# Patient Record
Sex: Female | Born: 1980 | Marital: Married | Smoking: Never smoker
Health system: Southern US, Community
[De-identification: ages and names within clinical notes are randomized; demographics above are authoritative.]

## PROBLEM LIST (undated history)

## (undated) ENCOUNTER — Inpatient Hospital Stay: Payer: Self-pay

## (undated) DIAGNOSIS — O1490 Unspecified pre-eclampsia, unspecified trimester: Secondary | ICD-10-CM

## (undated) HISTORY — DX: Unspecified pre-eclampsia, unspecified trimester: O14.90

## (undated) HISTORY — PX: CHOLECYSTECTOMY: SHX55

---

## 2005-02-26 ENCOUNTER — Emergency Department: Payer: Self-pay | Admitting: Emergency Medicine

## 2007-02-24 ENCOUNTER — Observation Stay: Payer: Self-pay

## 2007-02-28 ENCOUNTER — Observation Stay: Payer: Self-pay | Admitting: Obstetrics and Gynecology

## 2007-03-04 ENCOUNTER — Ambulatory Visit: Payer: Self-pay

## 2007-03-15 ENCOUNTER — Inpatient Hospital Stay: Payer: Self-pay

## 2007-03-15 ENCOUNTER — Observation Stay: Payer: Self-pay

## 2007-03-16 DIAGNOSIS — O139 Gestational [pregnancy-induced] hypertension without significant proteinuria, unspecified trimester: Secondary | ICD-10-CM

## 2007-06-26 ENCOUNTER — Ambulatory Visit: Payer: Self-pay | Admitting: Family Medicine

## 2007-07-09 ENCOUNTER — Ambulatory Visit: Payer: Self-pay | Admitting: General Surgery

## 2007-07-11 ENCOUNTER — Ambulatory Visit: Payer: Self-pay | Admitting: General Surgery

## 2008-02-16 ENCOUNTER — Encounter: Payer: Self-pay | Admitting: Obstetrics and Gynecology

## 2008-03-01 ENCOUNTER — Observation Stay: Payer: Self-pay

## 2008-03-02 ENCOUNTER — Ambulatory Visit: Payer: Self-pay | Admitting: Obstetrics & Gynecology

## 2008-03-03 ENCOUNTER — Inpatient Hospital Stay: Payer: Self-pay

## 2008-03-18 ENCOUNTER — Observation Stay: Payer: Self-pay | Admitting: Unknown Physician Specialty

## 2008-03-22 ENCOUNTER — Observation Stay: Payer: Self-pay

## 2008-04-03 ENCOUNTER — Inpatient Hospital Stay: Payer: Self-pay

## 2008-04-03 DIAGNOSIS — O139 Gestational [pregnancy-induced] hypertension without significant proteinuria, unspecified trimester: Secondary | ICD-10-CM

## 2008-10-09 IMAGING — US ABDOMEN ULTRASOUND
1 series · 17 of 25 positions shown · non-contrast
Comparison: none

REASON FOR EXAM: epigastric pain
COMMENTS:

[Series 1: abdomen ultrasound · 17 of 65 slices shown]
[im 1/65]
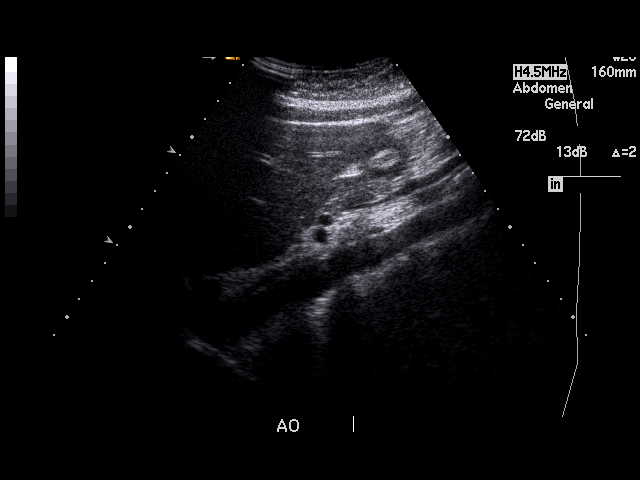
[im 6/65]
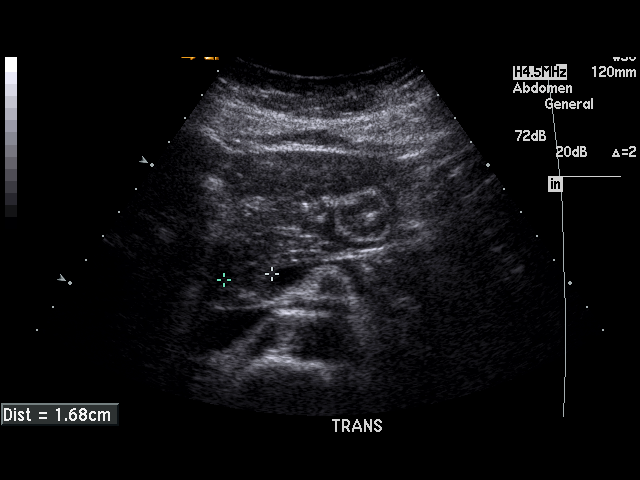
[im 9/65]
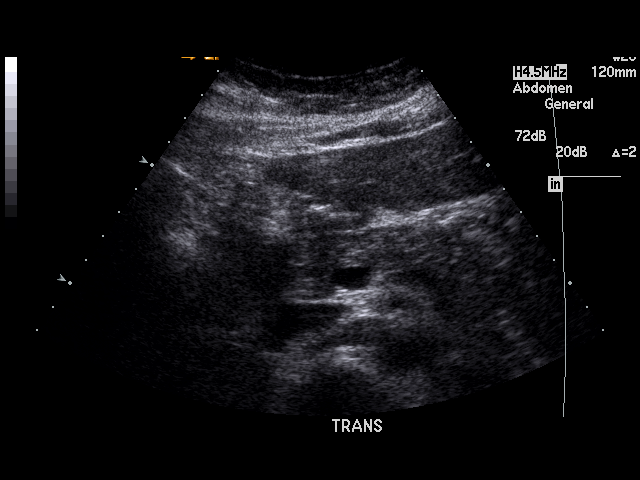
[im 14/65]
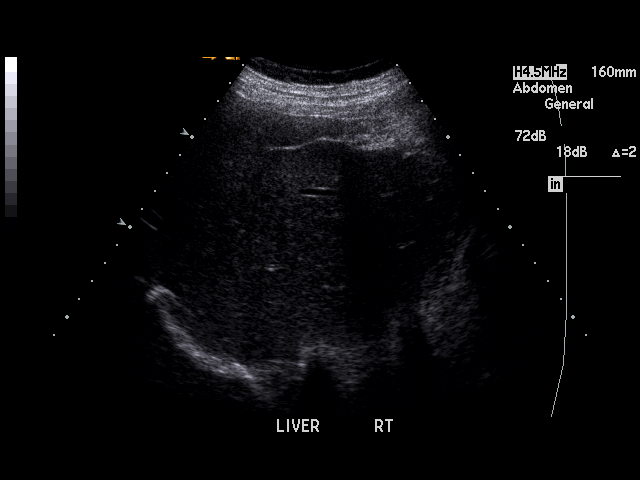
[im 17/65]
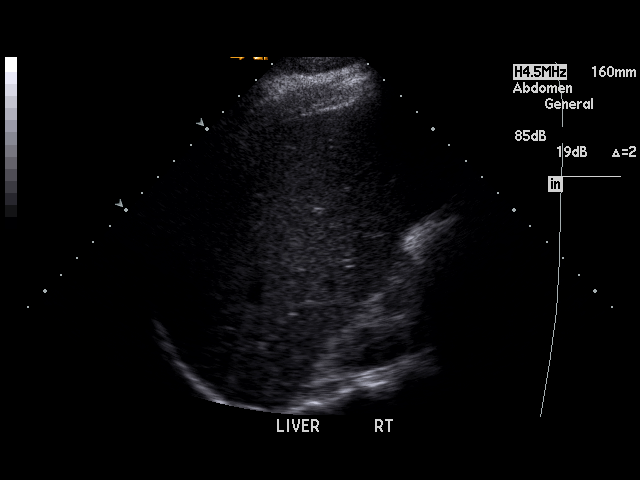
[im 22/65]
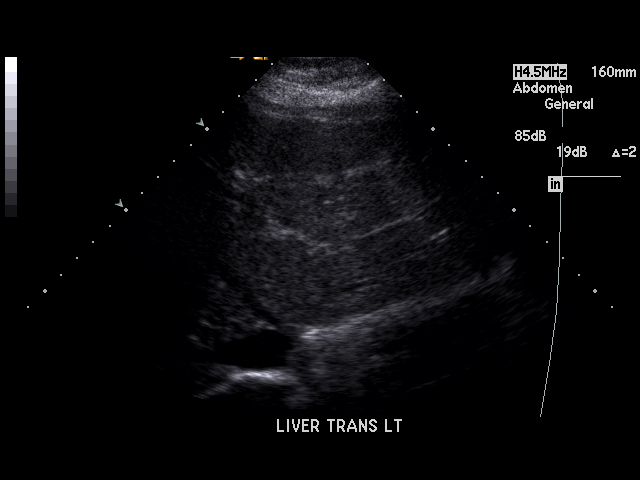
[im 25/65]
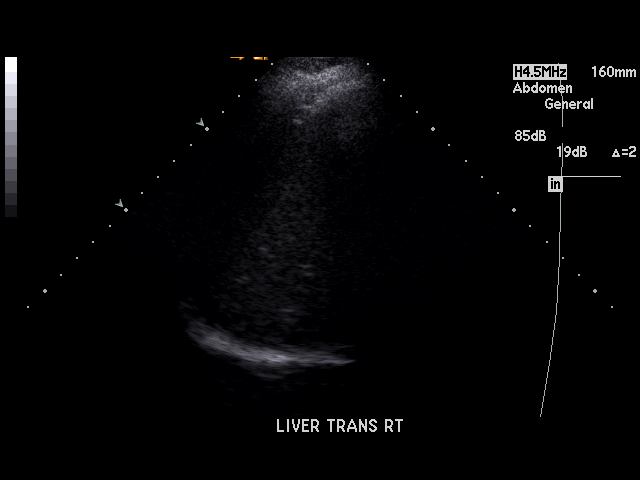
[im 30/65]
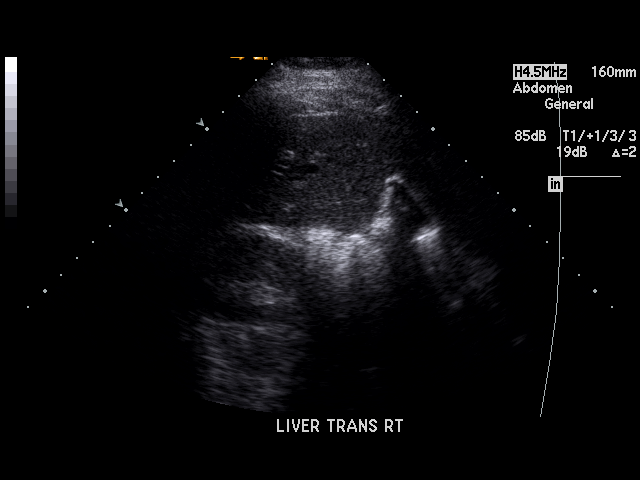
[im 33/65]
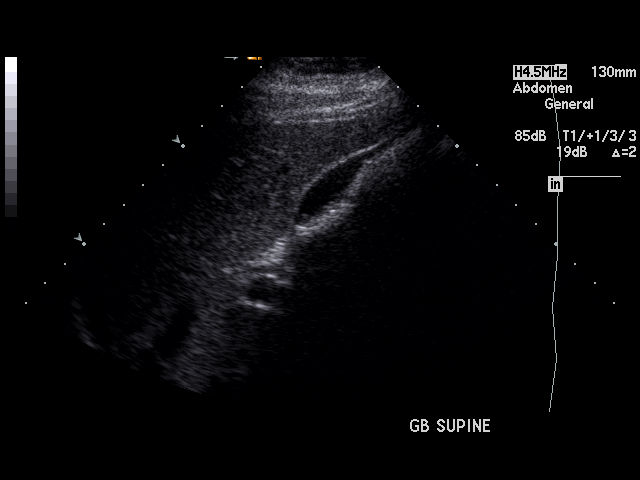
[im 35/65]
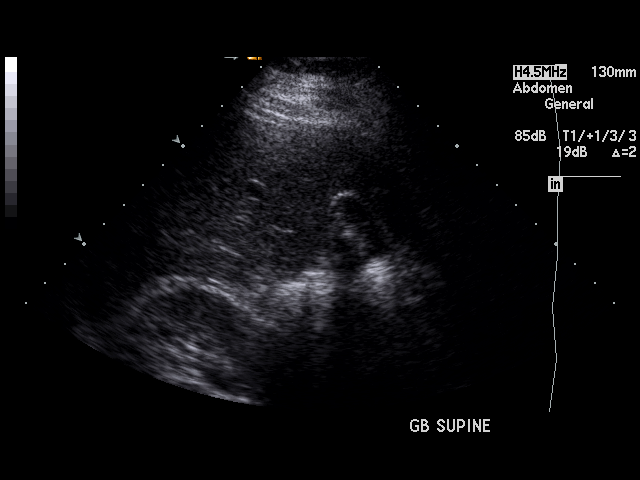
[im 41/65]
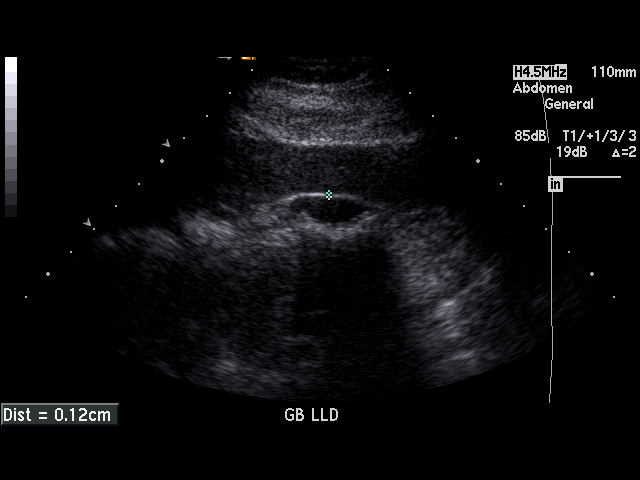
[im 43/65]
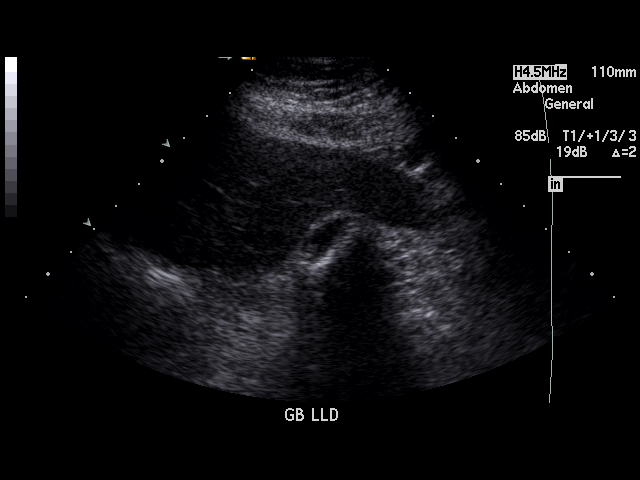
[im 49/65]
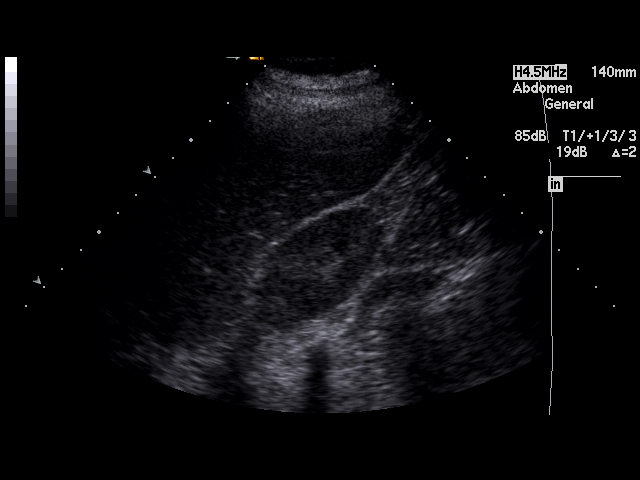
[im 51/65]
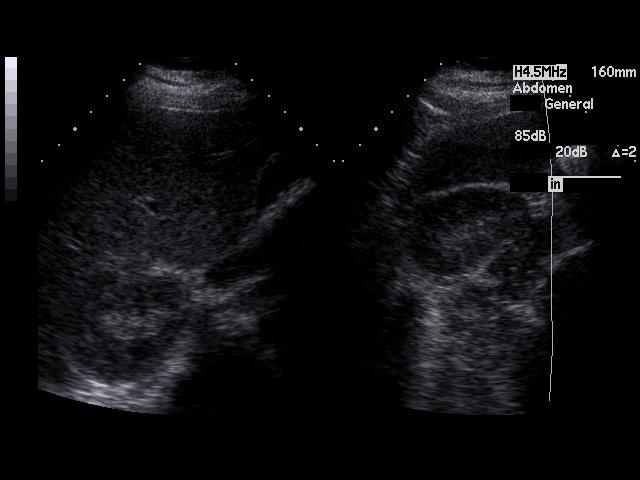
[im 57/65]
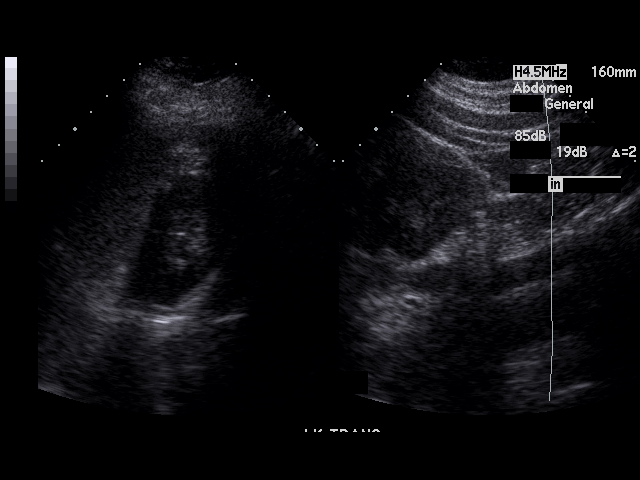
[im 59/65]
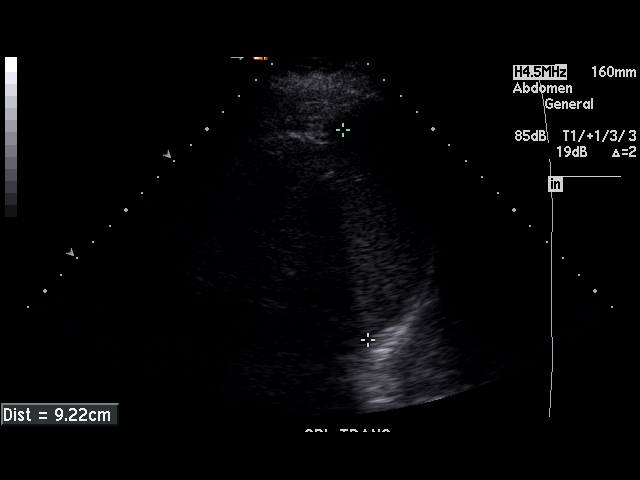
[im 65/65]
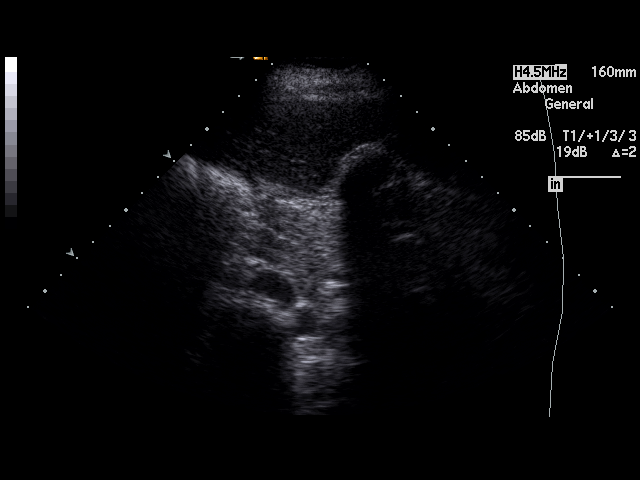

[17 of 25 positions shown; findings below may reference images not displayed]

PROCEDURE:     US  - US ABDOMEN GENERAL SURVEY  - June 26, 2007 [DATE]

RESULT:     The liver, spleen, pancreas, abdominal aorta and inferior vena
cava show no significant abnormalities. There are multiple mobile
echodensities in the gallbladder compatible with gallstones. No thickening
of the gallbladder wall is seen. The common bile duct measures 4.5 mm in
diameter which is within normal limits. The kidneys show no hydronephrosis.
There is no ascites.
IMPRESSION: 1.     Cholelithiasis.

## 2009-07-01 ENCOUNTER — Emergency Department: Payer: Self-pay | Admitting: Emergency Medicine

## 2009-07-18 ENCOUNTER — Encounter: Payer: Self-pay | Admitting: Family Medicine

## 2009-08-14 ENCOUNTER — Encounter: Payer: Self-pay | Admitting: Family Medicine

## 2009-12-02 ENCOUNTER — Ambulatory Visit: Payer: Self-pay | Admitting: Family Medicine

## 2010-10-15 IMAGING — CR CERVICAL SPINE - COMPLETE 4+ VIEW
1 series · 5 of 5 positions shown · non-contrast
Comparison: none

REASON FOR EXAM: neck  pain
COMMENTS:

[Series 1: view not recorded · 0.17mm/px · 5 of 5 slices shown]
[im 1/5]
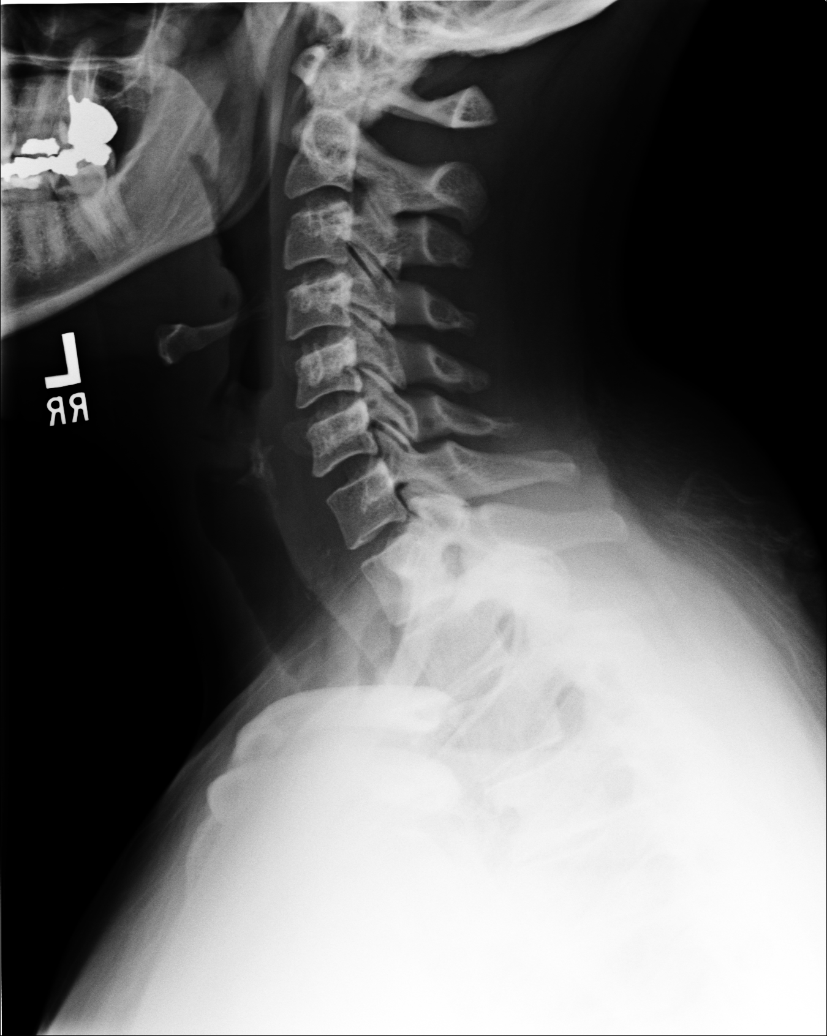
[im 2/5]
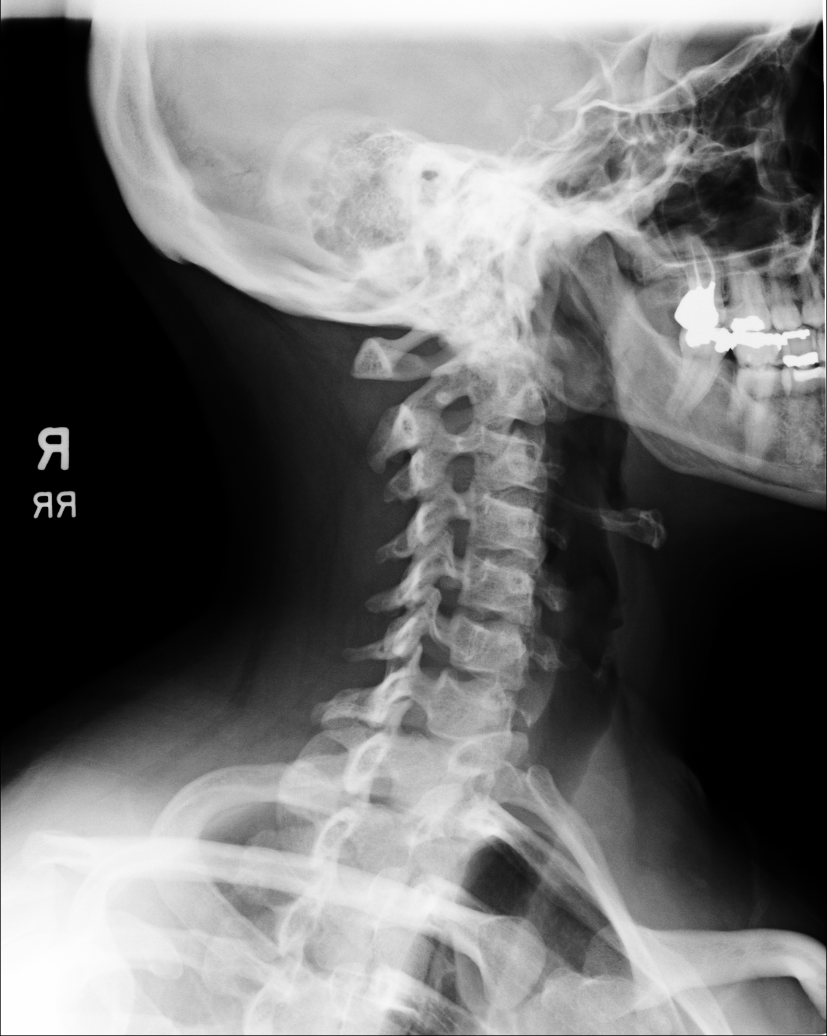
[im 3/5]
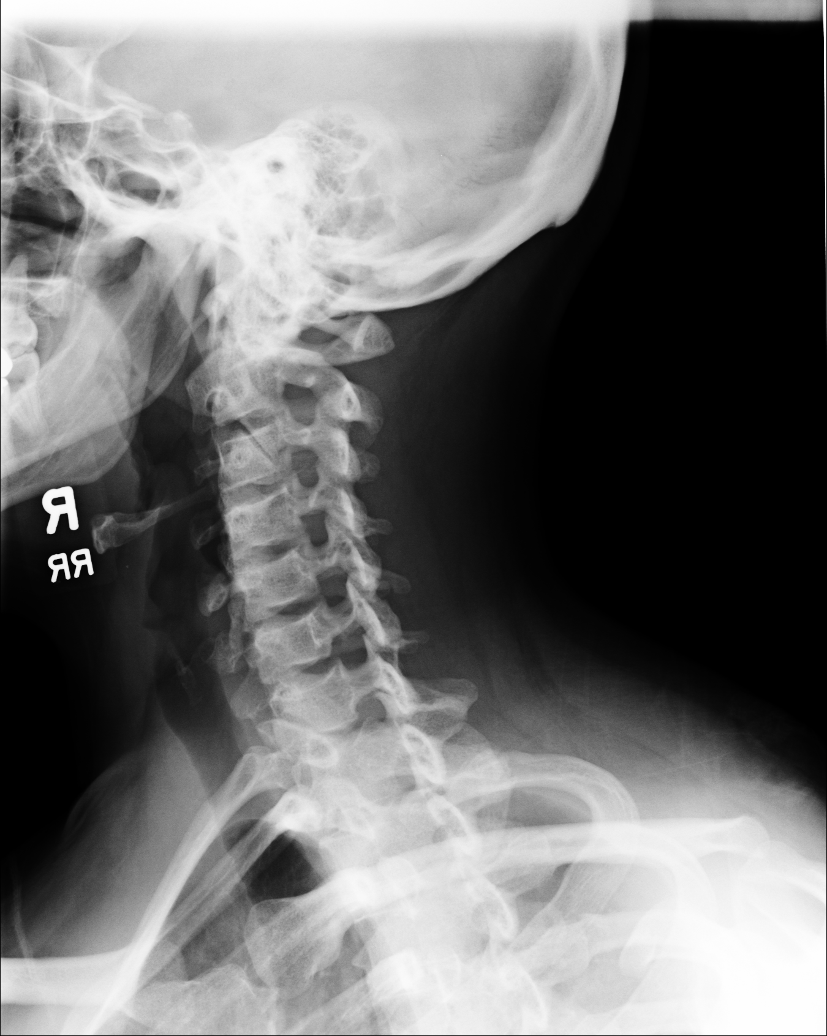
[im 4/5]
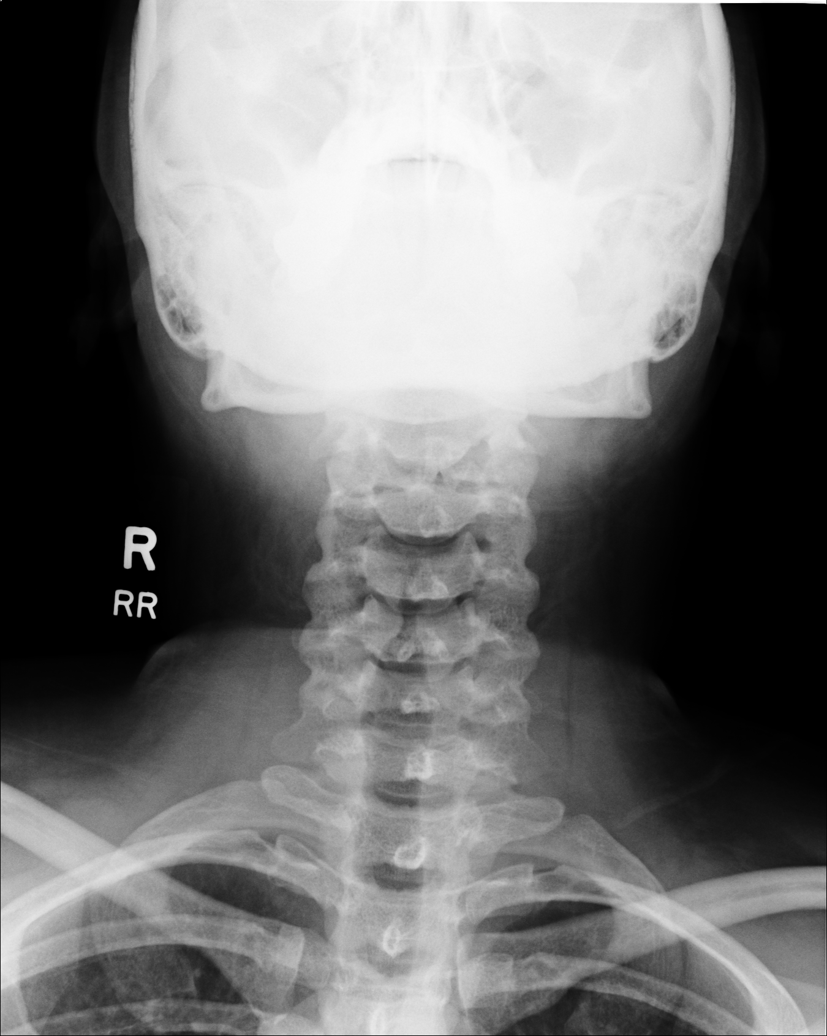
[im 5/5]
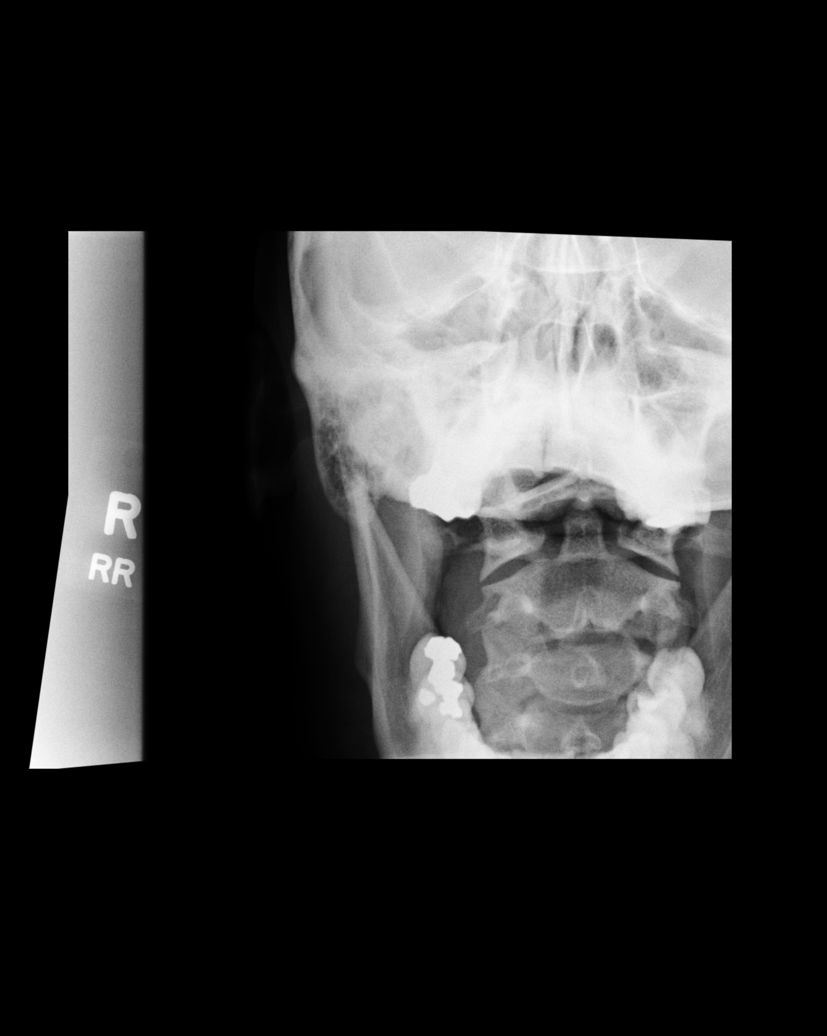

[5 of 5 positions shown; findings below may reference images not displayed]

PROCEDURE:     DXR - DXR CERVICAL SPINE COMPLETE  - July 01, 2009 [DATE]

RESULT:     The vertebral body heights and the intervertebral disc spaces
are well maintained. The vertebral body alignment is normal. Oblique views
show the neural foramina to be widely patent bilaterally. The odontoid
process is intact. Soft tissues of the cervical area are normal in
appearance.
IMPRESSION: No significant abnormalities are noted.

## 2011-02-03 ENCOUNTER — Emergency Department: Payer: Self-pay | Admitting: Unknown Physician Specialty

## 2011-03-18 IMAGING — CR DG WRIST COMPLETE 3+V*R*
1 series · 4 of 4 positions shown · non-contrast
Comparison: none

REASON FOR EXAM: [id number] right wrist sprain
COMMENTS:

PROCEDURE:     KDR - KDXR WRIST RT COMP WITH OBLIQUES  - December 02, 2009  [DATE]
RESULT:     No fracture, dislocation or other acute bony abnormality is
identified. No periosteal reactive changes are noted. No lytic or blastic
lesions are seen.

[Series 2: view not recorded · 0.17mm/px · 4 of 4 slices shown]
[im 1/4]
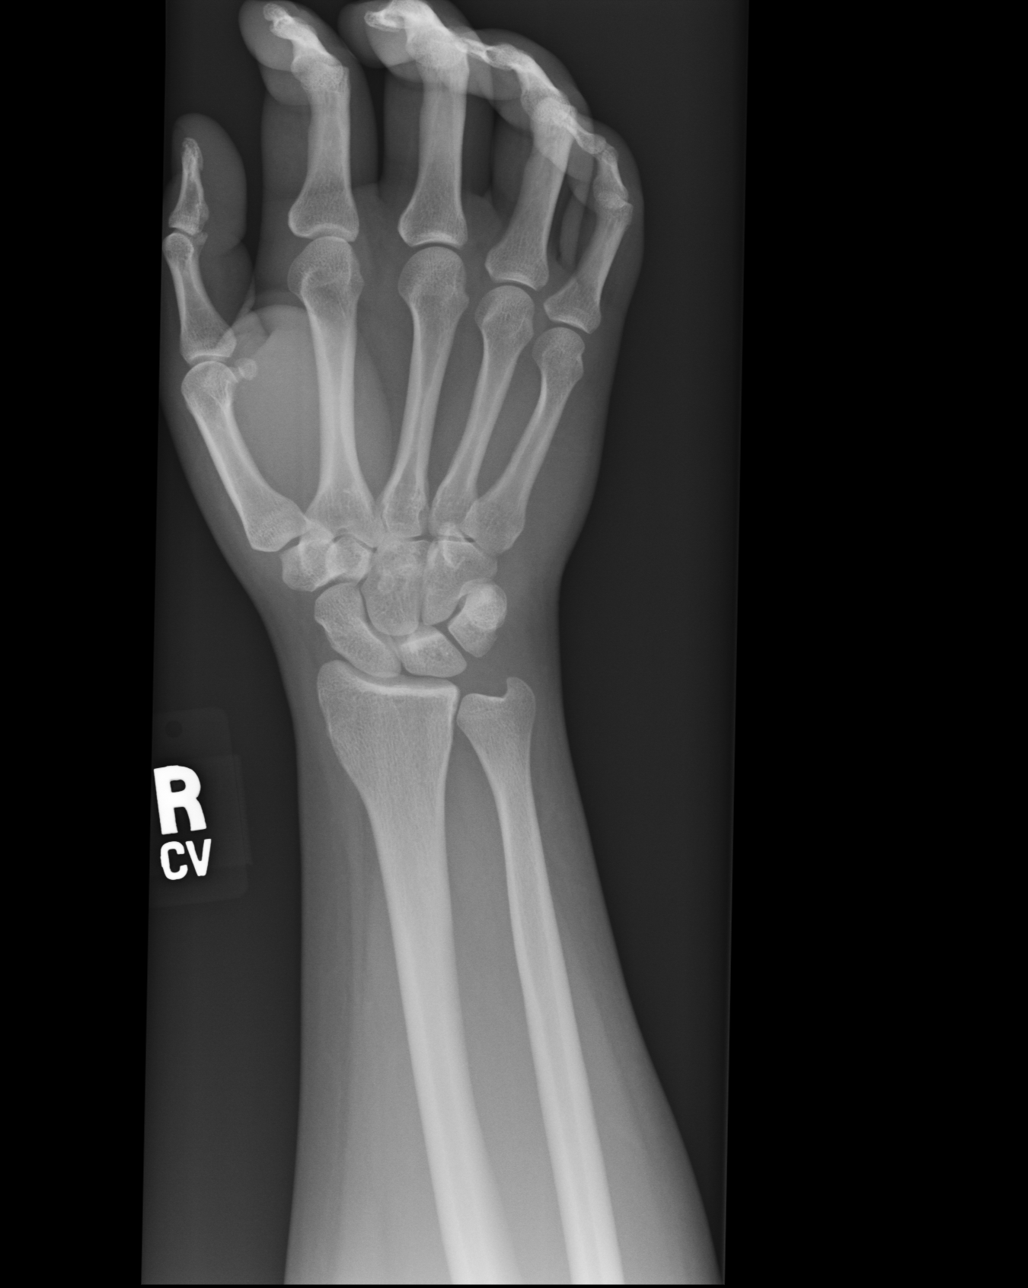
[im 2/4]
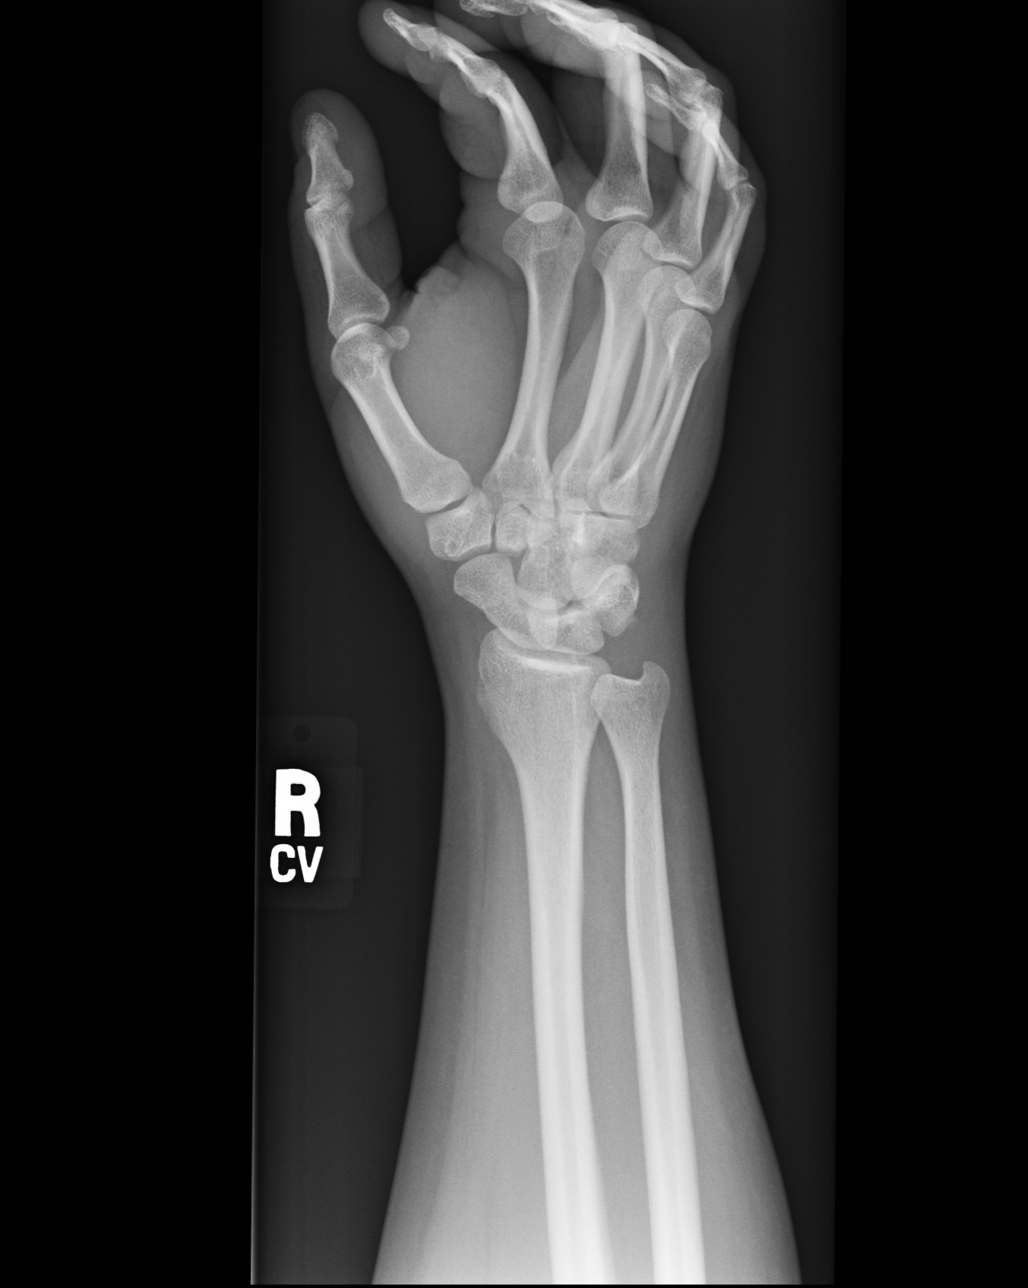
[im 3/4]
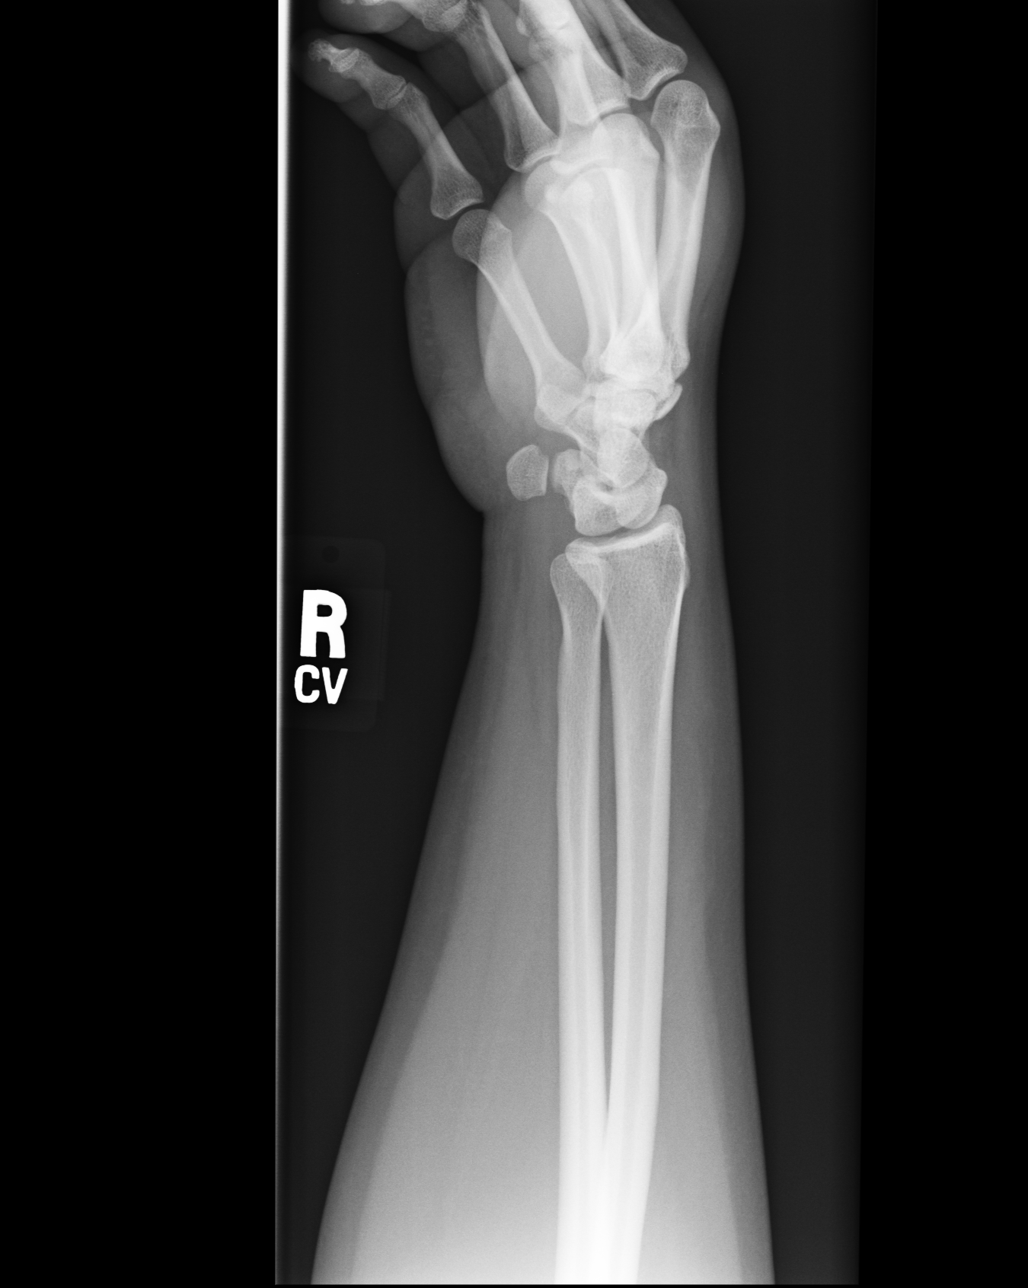
[im 4/4]
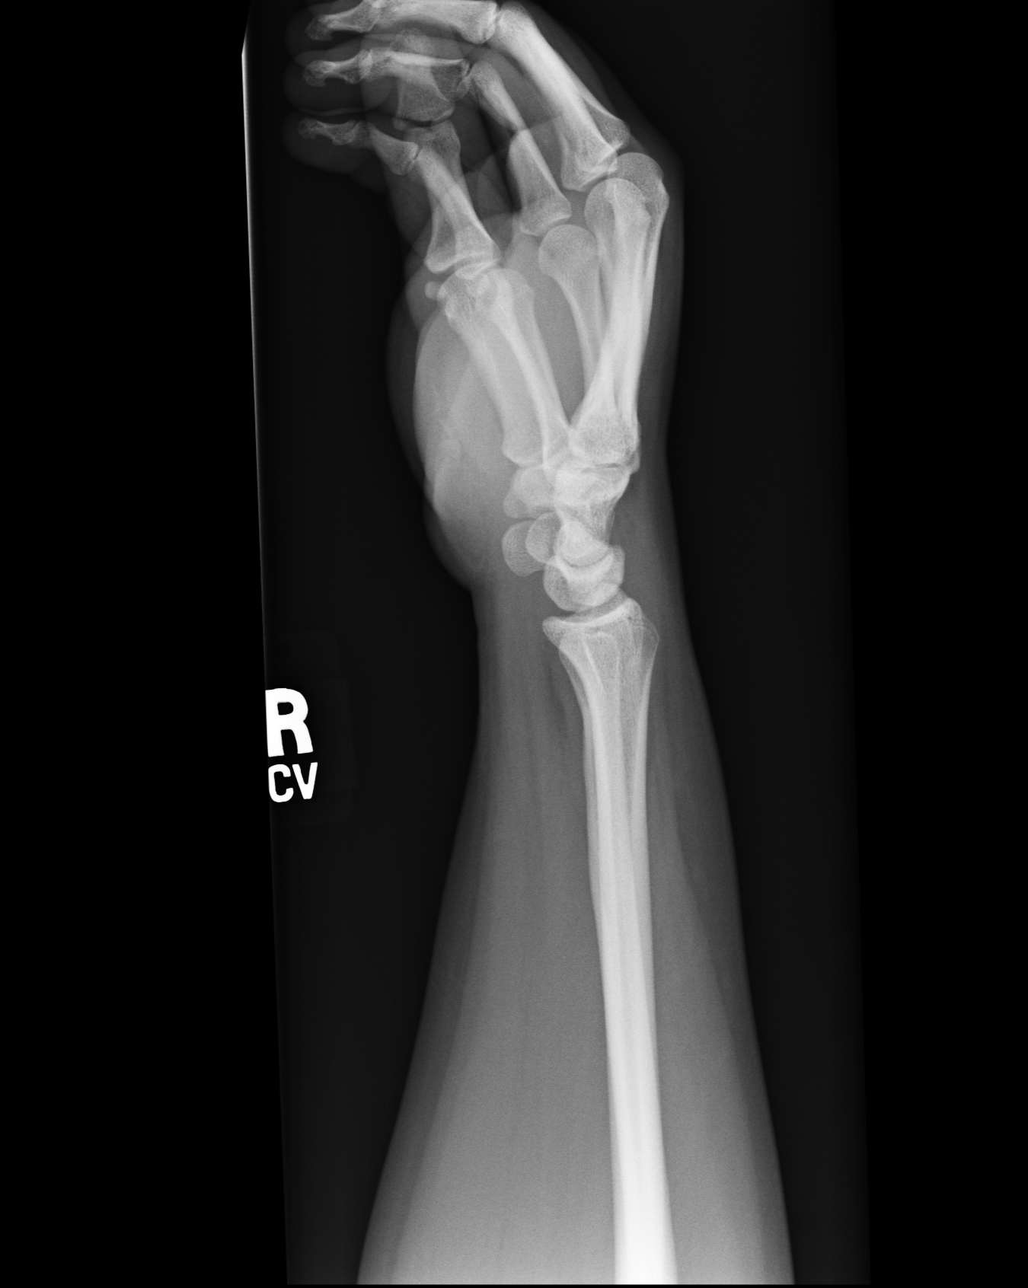

[4 of 4 positions shown; findings below may reference images not displayed]

IMPRESSION: 1. No significant abnormalities are noted.

## 2011-08-02 ENCOUNTER — Inpatient Hospital Stay: Payer: Self-pay

## 2011-08-02 LAB — CBC WITH DIFFERENTIAL/PLATELET
Basophil #: 0 10*3/uL (ref 0.0–0.1)
Basophil %: 0.1 %
Eosinophil #: 0 10*3/uL (ref 0.0–0.7)
Eosinophil %: 0.2 %
HCT: 35.4 % (ref 35.0–47.0)
Lymphocyte #: 2.1 10*3/uL (ref 1.0–3.6)
MCV: 92 fL (ref 80–100)
Monocyte #: 0.7 x10 3/mm (ref 0.2–0.9)
Monocyte %: 5.4 %
Neutrophil #: 9.6 10*3/uL — ABNORMAL HIGH (ref 1.4–6.5)
Neutrophil %: 77.7 %
RBC: 3.85 10*6/uL (ref 3.80–5.20)
WBC: 12.4 10*3/uL — ABNORMAL HIGH (ref 3.6–11.0)

## 2011-08-03 LAB — HEMATOCRIT: HCT: 32.8 % — ABNORMAL LOW (ref 35.0–47.0)

## 2014-01-21 LAB — OB RESULTS CONSOLE RPR: RPR: NONREACTIVE

## 2014-01-21 LAB — OB RESULTS CONSOLE RUBELLA ANTIBODY, IGM: Rubella: IMMUNE

## 2014-01-21 LAB — OB RESULTS CONSOLE HEPATITIS B SURFACE ANTIGEN: Hepatitis B Surface Ag: NEGATIVE

## 2014-01-21 LAB — OB RESULTS CONSOLE VARICELLA ZOSTER ANTIBODY, IGG: Varicella: IMMUNE

## 2014-01-21 LAB — OB RESULTS CONSOLE HIV ANTIBODY (ROUTINE TESTING): HIV: NONREACTIVE

## 2014-01-26 LAB — OB RESULTS CONSOLE ANTIBODY SCREEN: Antibody Screen: NEGATIVE

## 2014-01-26 LAB — OB RESULTS CONSOLE ABO/RH: RH Type: POSITIVE

## 2014-01-26 LAB — OB RESULTS CONSOLE HIV ANTIBODY (ROUTINE TESTING): HIV: NONREACTIVE

## 2014-04-16 NOTE — L&D Delivery Note (Signed)
Delivery Note At 12:04 AM a viable female was delivered via Vaginal, Spontaneous Delivery (Presentation: Vertex).  APGAR: 9, 9; weight 7 lb 0.5 oz (3189 g).   Placenta status: Intact, Spontaneous.  Cord: 3 vessels with the following complications: None.   Anesthesia: None  Episiotomy: None Lacerations: None Est. Blood Loss (mL): 200  Mom to postpartum.  Baby to Couplet care / Skin to Skin.  Marta AntuBrothers, Hadi Dubin 08/19/2014, 3:17 AM

## 2014-08-12 LAB — OB RESULTS CONSOLE GBS
STREP GROUP B AG: NEGATIVE
STREP GROUP B AG: NEGATIVE

## 2014-08-18 ENCOUNTER — Inpatient Hospital Stay
Admission: EM | Admit: 2014-08-18 | Discharge: 2014-08-20 | DRG: 775 | Disposition: A | Discharge status: Home / Self Care | Payer: Medicaid Other | Attending: Obstetrics and Gynecology | Admitting: Obstetrics and Gynecology

## 2014-08-18 DIAGNOSIS — Z3A37 37 weeks gestation of pregnancy: Secondary | ICD-10-CM | POA: Diagnosis present

## 2014-08-18 MED ORDER — MISOPROSTOL 200 MCG PO TABS
ORAL_TABLET | ORAL | Status: AC
  Filled 2014-08-18: qty 4

## 2014-08-18 MED ORDER — AMMONIA AROMATIC IN INHA
RESPIRATORY_TRACT | Status: AC
  Filled 2014-08-18: qty 10

## 2014-08-18 MED ORDER — OXYTOCIN 10 UNIT/ML IJ SOLN
INTRAMUSCULAR | Status: AC
  Filled 2014-08-18: qty 2

## 2014-08-18 MED ORDER — LIDOCAINE HCL (PF) 1 % IJ SOLN
INTRAMUSCULAR | Status: AC
  Filled 2014-08-18: qty 30

## 2014-08-19 DIAGNOSIS — Z3A37 37 weeks gestation of pregnancy: Secondary | ICD-10-CM | POA: Diagnosis present

## 2014-08-19 MED ORDER — LIDOCAINE HCL (PF) 1 % IJ SOLN
30.0000 mL | INTRAMUSCULAR | Status: DC | PRN
  Filled 2014-08-19: qty 30

## 2014-08-19 MED ORDER — DIPHENHYDRAMINE HCL 25 MG PO CAPS: 25.0000 mg | ORAL_CAPSULE | Freq: Four times a day (QID) | ORAL | Status: DC | PRN

## 2014-08-19 MED ORDER — DOCUSATE SODIUM 100 MG PO CAPS: 100.0000 mg | ORAL_CAPSULE | Freq: Two times a day (BID) | ORAL | Status: DC | PRN

## 2014-08-19 MED ORDER — IBUPROFEN 600 MG PO TABS
600.0000 mg | ORAL_TABLET | Freq: Four times a day (QID) | ORAL | Status: DC
  Administered 2014-08-19 – 2014-08-20 (×5): 600 mg via ORAL
  Filled 2014-08-19 (×5): qty 1

## 2014-08-19 MED ORDER — FERROUS SULFATE 325 (65 FE) MG PO TABS
325.0000 mg | ORAL_TABLET | Freq: Every day | ORAL | Status: DC
  Administered 2014-08-19 – 2014-08-20 (×2): 325 mg via ORAL
  Filled 2014-08-19 (×2): qty 1

## 2014-08-19 MED ORDER — ONDANSETRON HCL 4 MG/2ML IJ SOLN: 4.0000 mg | INTRAMUSCULAR | Status: DC | PRN

## 2014-08-19 MED ORDER — OXYTOCIN 10 UNIT/ML IJ SOLN: 10.0000 [IU] | Freq: Once | INTRAMUSCULAR | Status: DC

## 2014-08-19 MED ORDER — HYDROCODONE-ACETAMINOPHEN 5-325 MG PO TABS
ORAL_TABLET | ORAL | Status: AC
  Administered 2014-08-19: 1
  Filled 2014-08-19: qty 1

## 2014-08-19 MED ORDER — MAGNESIUM HYDROXIDE 400 MG/5ML PO SUSP: 30.0000 mL | ORAL | Status: DC | PRN

## 2014-08-19 MED ORDER — ONDANSETRON HCL 4 MG PO TABS: 4.0000 mg | ORAL_TABLET | ORAL | Status: DC | PRN

## 2014-08-19 MED ORDER — IBUPROFEN 600 MG PO TABS: 600.0000 mg | ORAL_TABLET | Freq: Four times a day (QID) | ORAL | Status: DC | PRN

## 2014-08-19 MED ORDER — IBUPROFEN 600 MG PO TABS
ORAL_TABLET | ORAL | Status: AC
  Administered 2014-08-19: 600 mg via ORAL
  Filled 2014-08-19: qty 1

## 2014-08-19 MED ORDER — SIMETHICONE 80 MG PO CHEW: 80.0000 mg | CHEWABLE_TABLET | ORAL | Status: DC | PRN

## 2014-08-19 MED ORDER — AMMONIA AROMATIC IN INHA
RESPIRATORY_TRACT | Status: AC
  Filled 2014-08-19: qty 10

## 2014-08-19 MED ORDER — MISOPROSTOL 200 MCG PO TABS
ORAL_TABLET | ORAL | Status: AC
  Filled 2014-08-19: qty 4

## 2014-08-19 MED ORDER — PRENATAL MULTIVITAMIN CH
1.0000 | ORAL_TABLET | Freq: Every day | ORAL | Status: DC
  Administered 2014-08-19: 1 via ORAL
  Filled 2014-08-19: qty 1

## 2014-08-19 MED ORDER — ACETAMINOPHEN 325 MG PO TABS: 650.0000 mg | ORAL_TABLET | ORAL | Status: DC | PRN

## 2014-08-19 MED ORDER — ZOLPIDEM TARTRATE 5 MG PO TABS: 5.0000 mg | ORAL_TABLET | Freq: Every evening | ORAL | Status: DC | PRN

## 2014-08-19 MED ORDER — HYDROCODONE-ACETAMINOPHEN 5-325 MG PO TABS
1.0000 | ORAL_TABLET | ORAL | Status: DC | PRN
  Administered 2014-08-19: 1 via ORAL
  Administered 2014-08-19: 2 via ORAL
  Filled 2014-08-19 (×2): qty 2

## 2014-08-19 MED ORDER — LIDOCAINE HCL (PF) 1 % IJ SOLN
INTRAMUSCULAR | Status: AC
  Filled 2014-08-19: qty 30

## 2014-08-19 MED ORDER — BENZOCAINE-MENTHOL 20-0.5 % EX AERO: 1.0000 "application " | INHALATION_SPRAY | CUTANEOUS | Status: DC | PRN

## 2014-08-19 MED ORDER — OXYTOCIN 10 UNIT/ML IJ SOLN
INTRAMUSCULAR | Status: AC
  Filled 2014-08-19: qty 2

## 2014-08-19 MED ORDER — LANOLIN HYDROUS EX OINT: TOPICAL_OINTMENT | CUTANEOUS | Status: DC | PRN

## 2014-08-19 NOTE — Progress Notes (Signed)
Post Partum Day 0 from a precipitous vaginal delivery  Subjective: no complaints, up ad lib and reports some cramping with pumping  Objective: Blood pressure 139/83, pulse 71, temperature 98.6 F (37 C), temperature source Oral, resp. rate 20, height 5\' 10"  (1.778 m), weight 102.059 kg (225 lb), SpO2 100 %, unknown if currently breastfeeding.  Physical Exam:  General: alert, cooperative and no distress Lochia: appropriate Uterine Fundus: firm Incision: NA DVT Evaluation: No evidence of DVT seen on physical exam.  No results for input(s): HGB, HCT in the last 72 hours.  Assessment/Plan: Plan for discharge on PPD 2 Continue pumping    LOS: 0 days   Hasel Janish,  Toni AmendCourtney 08/19/2014, 11:00 AM

## 2014-08-19 NOTE — H&P (Signed)
Obstetric History and Physical  Anne Mendoza is a 34 y.o. [id number] with EDD of 09/04/14 per LMP & 9 wk US. Patient presents at 5545w4d with c/o ctx, dilated 10 cm on admission with delivery shortly after arrival. Denies vaginal bleeding, LOF or decreased FM .    Prenatal Course Source of Care: [facility]  with onset of care at 7 weeks Pregnancy complications or risks: Patient Active Problem List   Diagnosis Date Noted  . Labor and delivery, indication for care 08/19/2014   She plans to breastfeed She desires undecided for postpartum contraception.   Prenatal labs and studies:   ABO, Rh: O pos Antibody: neg Rubella: Immune Varicella: Immune RPR:  NR HBsAg:  neg HIV: Neg GC/CT: neg/neg GBS:neg 1 hr Glucola  84 Genetic screening: 1st trimester negative   Prenatal Transfer Tool   History reviewed. No pertinent past medical history.  No past surgical history on file.  OB History  Gravida Para Term Preterm AB SAB TAB Ectopic Multiple Living  4 4 3  0 0 0 0 0 0 4    # Outcome Date GA Lbr Len/2nd Weight Sex Delivery Anes PTL Lv  4 Para 08/19/14  01:59 / 00:05 3.189 kg (7 lb 0.5 oz) M Vag-Spont None  Y     Comments: none  3 Term           2 Term           1 Term               History   Social History  . Marital Status: Married    Spouse Name: N/A  . Number of Children: N/A  . Years of Education: N/A   Social History Main Topics  . Smoking status: Not on file  . Smokeless tobacco: Not on file  . Alcohol Use: Not on file  . Drug Use: Not on file  . Sexual Activity: Not on file   Other Topics Concern  . None   Social History Narrative    No family history on file.  Prescriptions prior to admission  Medication Sig Dispense Refill Last Dose  . Prenatal Vit-Fe Fumarate-FA (PRENATAL MULTIVITAMIN) TABS tablet Take 1 tablet by mouth daily at 12 noon.   08/19/2014 at Unknown time    No Known Allergies  Review of Systems: Negative except for what is  mentioned in HPI.  Physical Exam: BP 124/77 mmHg  Pulse 78  Temp(Src) 98.7 F (37.1 C) (Oral)  Resp 18  Breastfeeding? Unknown GENERAL: Well-developed, well-nourished female in no acute distress.  LUNGS: Clear to auscultation bilaterally.  HEART: Regular rate and rhythm. ABDOMEN: Soft, nontender, nondistended, gravid. EXTREMITIES: Nontender, no edema Cervical Exam: Dilatation 10cm   Effacement 100%   Station 1   Presentation: cephalic FHT: Category: Baseline rate 150 bpm, very short FHR strip before delivery Pertinent Labs/Studies:   No results found for this or any previous visit (from the past 24 hour(s)).  Assessment : IUP at 37wks being admitted for labor.  Plan: Admission for precipitous delivery

## 2014-08-19 NOTE — Progress Notes (Signed)
Taken during contraction   08/18/14 2343  Vital Signs  BP (!) 164/86 mmHg

## 2014-08-20 LAB — CBC
HCT: 35 % (ref 35.0–47.0)
Hemoglobin: 11.6 g/dL — ABNORMAL LOW (ref 12.0–16.0)
MCH: 29.2 pg (ref 26.0–34.0)
MCHC: 33.2 g/dL (ref 32.0–36.0)
MCV: 88 fL (ref 80.0–100.0)
Platelets: 253 10*3/uL (ref 150–440)
RBC: 3.98 MIL/uL (ref 3.80–5.20)
RDW: 13.4 % (ref 11.5–14.5)
WBC: 12.7 10*3/uL — AB (ref 3.6–11.0)

## 2014-08-20 MED ORDER — IBUPROFEN 600 MG PO TABS: 600.0000 mg | ORAL_TABLET | Freq: Four times a day (QID) | ORAL | Status: DC

## 2014-08-20 MED ORDER — FERROUS SULFATE 325 (65 FE) MG PO TABS: 325.0000 mg | ORAL_TABLET | Freq: Every day | ORAL | Status: DC

## 2014-08-20 NOTE — Discharge Summary (Signed)
Obstetric Discharge Summary Reason for Admission: onset of labor Prenatal Procedures: NST Intrapartum Procedures: spontaneous vaginal delivery Postpartum Procedures: none Complications-Operative and Postpartum: none HEMOGLOBIN  Date Value Ref Range Status  08/20/2014 11.6* 12.0 - 16.0 g/dL Final   HGB  Date Value Ref Range Status  08/02/2011 11.5* 12.0-16.0 g/dL Final   HCT  Date Value Ref Range Status  08/20/2014 35.0 35.0 - 47.0 % Final  08/03/2011 32.8* 35.0-47.0 % Final    Physical Exam:  General: alert and no distress Lochia: appropriate Uterine Fundus: firm Incision: none  Discharge Diagnoses: Term Pregnancy-delivered  Discharge Information: Date: 08/20/2014 Activity: pelvic rest Diet: routine Medications: PNV, Ibuprofen and Iron Condition: stable Instructions: refer to practice specific booklet Contraception natural family planning Discharge to: home   Newborn Data: Live born female  Birth Weight: 7 lb 0.5 oz (3189 g) APGAR: 9, 9  Home with mother.  Anne Mendoza M 08/20/2014, 9:45 AM

## 2014-08-24 NOTE — H&P (Signed)
L&D Evaluation:  History:   HPI 34 year old G 3 P2002 with EDC=08/16/2011 by LMP=11/09/2011 presents at 38 weeks with c/o strong contractions since 0700 this Am. No LOF or bleeding. [facility] for early prenatal care, first trimester ultrasound confirming dates, and elevate one hr GTT with normal 3hr GTT, palpitations necessitating a cardiology consult/Holter monitor (declined meds for palpitations), a normal anatomy scan, and TDAP on 07/04/11 Labs: O POS, Varicella equivocal, Rubella immune, GBS negative. Hx of SVD x2 delivering 6# and 7#5 oz babies in 2008 and 2009.    Presents with contractions    Patient's Medical History Palpitations    Patient's Surgical History laprascopic cholecystectomy    Medications Pre Natal Vitamins  Fish oil    Allergies NKDA    Social History none    Family History Non-Contributory   ROS:   ROS see HPI   Exam:   General breathing with contractions, calm    Mental Status clear    Chest clear    Heart normal sinus rhythm, no murmur/gallop/rubs    Abdomen gravid, tender with contractions    Estimated Fetal Weight Average for gestational age    Fetal Position vtx    Edema no edema    Reflexes 1+    Pelvic 8-9 cm dilated on admission, a rim/+1 with my exam.    Mebranes Intact    FHT 125-130 with decels with contractions and accels    FHT Description variable decels with contractions    Ucx regular, q2-3   Impression:   Impression active labor, Delivery imminent   Plan:   Plan AROM and delivery.   Electronic Signatures: Trinna BalloonGutierrez, Yisell Sprunger L (CNM)  (Signed 18-Apr-13 10:40)  Authored: L&D Evaluation   Last Updated: 18-Apr-13 10:40 by Trinna BalloonGutierrez, Kavon Valenza L (CNM)

## 2016-10-12 ENCOUNTER — Ambulatory Visit (INDEPENDENT_AMBULATORY_CARE_PROVIDER_SITE_OTHER): Payer: Medicaid Other | Admitting: Obstetrics and Gynecology

## 2016-10-12 ENCOUNTER — Encounter: Payer: Self-pay | Admitting: Obstetrics and Gynecology

## 2016-10-12 VITALS — BP 124/70 | Wt 208.0 lb

## 2016-10-12 DIAGNOSIS — Z3A09 9 weeks gestation of pregnancy: Secondary | ICD-10-CM

## 2016-10-12 DIAGNOSIS — O0990 Supervision of high risk pregnancy, unspecified, unspecified trimester: Secondary | ICD-10-CM | POA: Insufficient documentation

## 2016-10-12 DIAGNOSIS — Z3481 Encounter for supervision of other normal pregnancy, first trimester: Secondary | ICD-10-CM

## 2016-10-12 NOTE — Progress Notes (Signed)
New Obstetric Patient H&P    Chief Complaint: "Desires prenatal care"   History of Present Illness: Patient is a 36 y.o. [id number] Unavailable female, LMP 08/09/17 presents with amenorrhea and positive home pregnancy test. Based on her  LMP, her EDD is Estimated Date of Delivery: 05/16/17 and her EGA is 3623w1d. Cycles are 4. days, regular, and occur approximately every : 28 days. Her last pap smear was 3 years ago and was no abnormalities.    She had a urine pregnancy test which was positive 5 week(s)  ago. Her last menstrual period was normal and lasted for  5 day(s). Since her LMP she claims she has experienced vaginal spotting a couple time (only on tissue and only when wipes). Her past medical history is noncontributory. Her prior pregnancies are notable for hypertensive disorders with G1 and G2.  Normal G3 and G4.  Since her LMP, she admits to the use of tobacco products  no She claims she has gained  zero pounds since the start of her pregnancy.  There are cats in the home in the home  no  She admits close contact with children on a regular basis  yes  She has had chicken pox in the past yes She has had Tuberculosis exposures, symptoms, or previously tested positive for TB   no Current or past history of domestic violence. no  Genetic Screening/Teratology Counseling: (Includes patient, baby's father, or anyone in either family with:)   1. [facility]  no 2. Thalassemia ([place], [place], Mediterranean, or Asian background): MCV<80  no 3. Neural tube defect (meningomyelocele, spina bifida, anencephaly)  no 4. Congenital heart defect  no  5. Down syndrome  no 6. Tay-Sachs (Jewish, [place])  no 7. Canavan's Disease  no 8. Sickle cell disease or trait (African)  no  9. Hemophilia or other blood disorders  no  10. Muscular dystrophy  no  11. Cystic fibrosis  no  12. Huntington's Chorea  no  13. Mental retardation/autism  no 14. Other inherited genetic or chromosomal  disorder  no 15. Maternal metabolic disorder (DM, PKU, etc)  no 16. Patient or FOB with a child with a birth defect not listed above no  16a. Patient or FOB with a birth defect themselves no 17. Recurrent pregnancy loss, or stillbirth  no  18. Any medications since LMP other than prenatal vitamins (include vitamins, supplements, OTC meds, drugs, alcohol)  no 19. Any other genetic/environmental exposure to discuss  no  Infection History:   1. Lives with someone with TB or TB exposed  no  2. Patient or partner has history of genital herpes  no 3. Rash or viral illness since LMP  no 4. History of STI (GC, CT, HPV, syphilis, HIV)  no 5. History of recent travel :  no  Other pertinent information:  no   Review of Systems: Review of Systems  Constitutional: Negative.   HENT: Negative.   Eyes: Negative.   Respiratory: Negative.   Cardiovascular: Negative.   Gastrointestinal: Negative.   Genitourinary: Negative.   Musculoskeletal: Negative.   Skin: Negative.   Neurological: Negative.   Psychiatric/Behavioral: Negative.     Past Medical History:  Diagnosis Date  . GERD (gastroesophageal reflux disease)   . Pre-eclampsia     Past Surgical History:  Procedure Laterality Date  . CHOLECYSTECTOMY      Gynecologic History: Patient's last menstrual period was 08/09/2016.  Obstetric History: [id number], SVD x 4, largest baby 7 lb 5oz  G1: GHTN, 37 week SVD G2: GHTN: 38 wk SVD G3: 38 wk SVD G4: 38 wk SVD  Family History  Problem Relation Age of Onset  . Lung cancer Maternal Uncle   . Brain cancer Cousin   . Breast cancer Cousin     Social History   Social History  . Marital status: Married    Spouse name: N/A  . Number of children: N/A  . Years of education: N/A   Occupational History  . Not on file.   Social History Main Topics  . Smoking status: Never Smoker  . Smokeless tobacco: Never Used  . Alcohol use No  . Drug use: No  . Sexual activity: Yes    Birth  control/ protection: None   Other Topics Concern  . Not on file   Social History Narrative  . No narrative on file    Allergies: No Known Allergies  Medications:   Medication Sig Start Date End Date Taking? Authorizing Provider  Prenatal Vit-Min-FA-Fish Oil ([facility] PRENATAL GUMMY PO) Take 2 each by mouth daily.   Yes [provider]  ferrous sulfate 325 (65 FE) MG tablet Take 1 tablet (325 mg total) by mouth daily with breakfast. Patient not taking: Reported on 10/12/2016 08/20/14   Vena [place], MD    Physical Exam BP 124/70   Wt 208 lb (94.3 kg)   LMP 08/09/2016   BMI 29.84 kg/m   Physical Exam  Constitutional: She is oriented to person, place, and time. She appears well-developed and well-nourished. No distress.  HENT:  Head: Normocephalic and atraumatic.  Eyes: Conjunctivae are normal.  Neck: Normal range of motion. Neck supple. No thyromegaly present.  Cardiovascular: Normal rate, regular rhythm and normal heart sounds.  Exam reveals no gallop and no friction rub.   No murmur heard. Pulmonary/Chest: Effort normal and breath sounds normal. She has no wheezes.  Abdominal: Soft. She exhibits no distension and no mass. There is no tenderness. There is no rebound and no guarding. No hernia. Hernia confirmed negative in the right inguinal area and confirmed negative in the left inguinal area.  Genitourinary: Vagina normal. Pelvic exam was performed with patient supine. There is no rash, tenderness, lesion or injury on the right labia. There is no rash, tenderness, lesion or injury on the left labia. Uterus is enlarged. Uterus is not fixed and not tender. Cervix exhibits no motion tenderness and no discharge. Right adnexum displays no mass and no tenderness. Left adnexum displays no mass and no tenderness. No erythema, tenderness or bleeding in the vagina. No signs of injury around the vagina.  Musculoskeletal: Normal range of motion.  Lymphadenopathy:    She has no  cervical adenopathy.       Right: No inguinal adenopathy present.       Left: No inguinal adenopathy present.  Neurological: She is alert and oriented to person, place, and time.  Skin: Skin is warm and dry. No rash noted.  Psychiatric: She has a normal mood and affect. Her behavior is normal. Judgment normal.   Female chaperone present during pelvic exam.   Assessment: 36 y.o. [id number] at [redacted]w[redacted]d presenting to initiate prenatal care  Plan: 1) Avoid alcoholic beverages. 2) Patient encouraged not to smoke.  3) Discontinue the use of all non-medicinal drugs and chemicals.  4) Take prenatal vitamins daily.  5) Nutrition, food safety (fish, cheese advisories, and high nitrite foods) and exercise discussed. 6) Hospital and practice style discussed with cross coverage system.  7) Genetic Screening,  such as with 1st Trimester Screening, cell free fetal DNA, AFP testing, and Ultrasound, as well as with amniocentesis and CVS as appropriate, is discussed with patient. At the conclusion of today's visit patient declined genetic testing 8) Patient is asked about travel to areas at risk for the [place] virus, and counseled to avoid travel and exposure to mosquitoes or sexual partners who may have themselves been exposed to the virus. Testing is discussed, and will be ordered as appropriate.   Thomasene Mohair, MD 10/12/2016 3:08 PM

## 2016-10-16 NOTE — Addendum Note (Signed)
Addended by: Thomasene MohairJACKSON, STEPHEN D on: 10/16/2016 08:22 AM   Modules accepted: Orders

## 2016-10-17 LAB — RPR+RH+ABO+RUB AB+AB SCR+CB...
Antibody Screen: NEGATIVE
HEMATOCRIT: 37.2 % (ref 34.0–46.6)
HEMOGLOBIN: 12.6 g/dL (ref 11.1–15.9)
HIV Screen 4th Generation wRfx: NONREACTIVE
Hepatitis B Surface Ag: NEGATIVE
MCH: 28.4 pg (ref 26.6–33.0)
MCHC: 33.9 g/dL (ref 31.5–35.7)
MCV: 84 fL (ref 79–97)
Platelets: 335 10*3/uL (ref 150–379)
RBC: 4.44 x10E6/uL (ref 3.77–5.28)
RDW: 13.9 % (ref 12.3–15.4)
RH TYPE: POSITIVE
RPR Ser Ql: NONREACTIVE
Rubella Antibodies, IGG: 1.64 index (ref >0.99)
Varicella zoster IgG: 517 index (ref >165)
WBC: 12.2 10*3/uL — ABNORMAL HIGH (ref 3.4–10.8)

## 2016-10-20 LAB — IGP,CTNG,APTIMAHPV,RFX16/18,45
Chlamydia, Nuc. Acid Amp: NEGATIVE
Gonococcus by Nucleic Acid Amp: NEGATIVE
HPV Aptima: NEGATIVE
PAP Smear Comment: 0

## 2016-10-24 ENCOUNTER — Ambulatory Visit (INDEPENDENT_AMBULATORY_CARE_PROVIDER_SITE_OTHER): Payer: Medicaid Other | Admitting: Advanced Practice Midwife

## 2016-10-24 ENCOUNTER — Ambulatory Visit (INDEPENDENT_AMBULATORY_CARE_PROVIDER_SITE_OTHER): Payer: Medicaid Other

## 2016-10-24 VITALS — BP 118/74 | Wt 209.0 lb

## 2016-10-24 DIAGNOSIS — Z362 Encounter for other antenatal screening follow-up: Secondary | ICD-10-CM | POA: Diagnosis not present

## 2016-10-24 DIAGNOSIS — Z3A10 10 weeks gestation of pregnancy: Secondary | ICD-10-CM

## 2016-10-24 DIAGNOSIS — Z3481 Encounter for supervision of other normal pregnancy, first trimester: Secondary | ICD-10-CM

## 2016-10-24 MED ORDER — VITAFOL GUMMIES 3.33-0.333-34.8 MG PO CHEW: 3.0000 | CHEWABLE_TABLET | Freq: Every day | ORAL | 6 refills | Status: DC

## 2016-10-24 NOTE — Progress Notes (Signed)
Dating scan today. No vb. No lof.  °

## 2016-10-24 NOTE — Progress Notes (Signed)
Having nausea and vomiting- sample of bonjesta given. Rx sent for Vitafol gummies. Dating scan size = dates

## 2016-10-29 ENCOUNTER — Encounter: Payer: Medicaid Other | Admitting: Obstetrics and Gynecology

## 2016-10-29 ENCOUNTER — Other Ambulatory Visit: Payer: Medicaid Other

## 2016-11-23 ENCOUNTER — Ambulatory Visit (INDEPENDENT_AMBULATORY_CARE_PROVIDER_SITE_OTHER): Payer: Medicaid Other | Admitting: Obstetrics and Gynecology

## 2016-11-23 VITALS — BP 120/74 | Wt 215.0 lb

## 2016-11-23 DIAGNOSIS — Z3A15 15 weeks gestation of pregnancy: Secondary | ICD-10-CM

## 2016-11-23 DIAGNOSIS — Z3482 Encounter for supervision of other normal pregnancy, second trimester: Secondary | ICD-10-CM

## 2016-11-23 NOTE — Progress Notes (Signed)
  Routine Prenatal Care Visit  Subjective  Anne Mendoza is a 36 y.o. [phone] at 3332w3d being seen today for ongoing prenatal care.  She is currently monitored for the following issues for this low-risk pregnancy and has Supervision of normal intrauterine pregnancy in multigravida in second trimester on her problem list.  ----------------------------------------------------------------------------------- Patient reports nausea, no bleeding, no cramping and vomiting.   Contractions: Not present. Vag. Bleeding: None.  Movement: Absent. Denies leaking of fluid.  ----------------------------------------------------------------------------------- The following portions of the patient's history were reviewed and updated as appropriate: allergies, current medications, past family history, past medical history, past social history, past surgical history and problem list. Problem list updated.   Objective  Blood pressure 120/74, weight 215 lb (97.5 kg), last menstrual period 08/09/2016, unknown if currently breastfeeding. Pregravid weight 208 lb (94.3 kg) Total Weight Gain 7 lb (3.175 kg) Urinalysis: Urine Protein: Negative Urine Glucose: Negative  Fetal Status: Fetal Heart Rate (bpm): 150   Movement: Absent     General:  Alert, oriented and cooperative. Patient is in no acute distress.  Skin: Skin is warm and dry. No rash noted.   Cardiovascular: Normal heart rate noted  Respiratory: Normal respiratory effort, no problems with respiration noted  Abdomen: Soft, gravid, appropriate for gestational age. Pain/Pressure: Absent     Pelvic:  Cervical exam deferred        Extremities: Normal range of motion.     ental Status: Normal mood and affect. Normal behavior. Normal judgment and thought content.     Assessment   36 y.o. [id number] at 3132w3d by  05/16/2017, by Last Menstrual Period presenting for routine prenatal visit  Plan   pregnancy Problems (from 10/12/16 to present)    Problem Noted  Resolved   Supervision of normal intrauterine pregnancy in multigravida in second trimester 10/12/2016 by Conard NovakJackson, Eric Nees D, MD No   Overview Addendum 11/23/2016  5:17 PM by Conard NovakJackson, Hillary Struss D, MD    Clinic [facility] Prenatal Labs  Dating LMP = 10 wk Blood type: O/Positive/-- (06/29 1510)   Genetic Screen declined   msAFP:      Antibody:Negative (06/29 1510)  Anatomic US  Rubella: 1.64 (06/29 1510)  Varicella: Immune  GTT Early:               Third trimester:  RPR: Non Reactive (06/29 1510)   Rhogam  HBsAg: Negative (06/29 1510)   TDaP vaccine                       Flu Shot: HIV: Non Reactive (06/29 1510)   Baby Food                                GBS:   Contraception  Pap:  CBB     CS/VBAC    Support Person                  Discussed conservative measures for nausea/vomiting. Overall, improving.  Continue to monitor. Anatomy ultrasound next visit.   Please refer to After Visit Summary for other counseling recommendations.   Return in about 4 weeks (around 12/21/2016) for schedule anatomy u/s and rob.  Thomasene MohairStephen Emmajo Bennette, MD  11/25/2016 2:29 PM

## 2016-12-21 ENCOUNTER — Ambulatory Visit (INDEPENDENT_AMBULATORY_CARE_PROVIDER_SITE_OTHER): Payer: Medicaid Other | Admitting: Obstetrics and Gynecology

## 2016-12-21 ENCOUNTER — Ambulatory Visit (INDEPENDENT_AMBULATORY_CARE_PROVIDER_SITE_OTHER): Payer: Medicaid Other

## 2016-12-21 VITALS — BP 110/68 | Wt 221.0 lb

## 2016-12-21 DIAGNOSIS — Z3482 Encounter for supervision of other normal pregnancy, second trimester: Secondary | ICD-10-CM

## 2016-12-21 DIAGNOSIS — Z3A19 19 weeks gestation of pregnancy: Secondary | ICD-10-CM

## 2016-12-21 DIAGNOSIS — IMO0002 Reserved for concepts with insufficient information to code with codable children: Secondary | ICD-10-CM

## 2016-12-21 DIAGNOSIS — Z362 Encounter for other antenatal screening follow-up: Secondary | ICD-10-CM

## 2016-12-21 DIAGNOSIS — Z048 Encounter for examination and observation for other specified reasons: Secondary | ICD-10-CM

## 2016-12-21 DIAGNOSIS — Z0489 Encounter for examination and observation for other specified reasons: Secondary | ICD-10-CM

## 2016-12-21 NOTE — Progress Notes (Signed)
Anatomy scan today/ It's a girl

## 2016-12-24 NOTE — Progress Notes (Signed)
Anatomy scan incomplete follow up in 4 weeks

## 2017-01-18 ENCOUNTER — Encounter: Payer: Medicaid Other | Admitting: Obstetrics & Gynecology

## 2017-01-18 ENCOUNTER — Other Ambulatory Visit: Payer: Medicaid Other

## 2017-01-28 ENCOUNTER — Ambulatory Visit (INDEPENDENT_AMBULATORY_CARE_PROVIDER_SITE_OTHER): Payer: Medicaid Other | Admitting: Obstetrics and Gynecology

## 2017-01-28 ENCOUNTER — Ambulatory Visit (INDEPENDENT_AMBULATORY_CARE_PROVIDER_SITE_OTHER): Payer: Medicaid Other

## 2017-01-28 VITALS — BP 118/74 | Wt 230.0 lb

## 2017-01-28 DIAGNOSIS — Z3A19 19 weeks gestation of pregnancy: Secondary | ICD-10-CM

## 2017-01-28 DIAGNOSIS — Z0489 Encounter for examination and observation for other specified reasons: Secondary | ICD-10-CM | POA: Diagnosis not present

## 2017-01-28 DIAGNOSIS — IMO0002 Reserved for concepts with insufficient information to code with codable children: Secondary | ICD-10-CM

## 2017-01-28 DIAGNOSIS — Z3A24 24 weeks gestation of pregnancy: Secondary | ICD-10-CM

## 2017-01-28 DIAGNOSIS — Z3482 Encounter for supervision of other normal pregnancy, second trimester: Secondary | ICD-10-CM

## 2017-01-28 NOTE — Progress Notes (Signed)
    Routine Prenatal Care Visit  Subjective  Anne Mendoza is a 36 y.o. [phone] at [redacted]w[redacted]d being seen today for ongoing prenatal care.  She is currently monitored for the following issues for this low-risk pregnancy and has Supervision of normal intrauterine pregnancy in multigravida in second trimester on her problem list.  ----------------------------------------------------------------------------------- Patient reports no complaints.   Contractions: Not present. Vag. Bleeding: None.  Movement: Present. Denies leaking of fluid.  ----------------------------------------------------------------------------------- The following portions of the patient's history were reviewed and updated as appropriate: allergies, current medications, past family history, past medical history, past social history, past surgical history and problem list. Problem list updated.   Objective  Blood pressure 118/74, weight 230 lb (104.3 kg), last menstrual period 08/09/2016, unknown if currently breastfeeding. Pregravid weight 208 lb (94.3 kg) Total Weight Gain 22 lb (9.979 kg) Urinalysis: Urine Protein: Negative Urine Glucose: Negative  Fetal Status: Fetal Heart Rate (bpm): 145   Movement: Present     General:  Alert, oriented and cooperative. Patient is in no acute distress.  Skin: Skin is warm and dry. No rash noted.   Cardiovascular: Normal heart rate noted  Respiratory: Normal respiratory effort, no problems with respiration noted  Abdomen: Soft, gravid, appropriate for gestational age. Pain/Pressure: Absent     Pelvic:  Cervical exam deferred        Extremities: Normal range of motion.     ental Status: Normal mood and affect. Normal behavior. Normal judgment and thought content.    There is no immunization history on file for this patient.  Assessment   36 y.o. [id number] at [redacted]w[redacted]d by  05/16/2017, by Last Menstrual Period presenting for routine prenatal visit  Plan   pregnancy Problems (from  10/12/16 to present)    Problem Noted Resolved   Supervision of normal intrauterine pregnancy in multigravida in second trimester 10/12/2016 by Conard Novak, MD No   Overview Addendum 12/24/2016  9:57 PM by Vena Austria, MD    Clinic [facility] Prenatal Labs  Dating LMP = 10 wk Blood type: O/Positive/-- (06/29 1510)   Genetic Screen declined   msAFP:      Antibody:Negative (06/29 1510)  Anatomic [place] Female, incomplete for 4-chamber and PCI 12/21/16 Rubella: 1.64 (06/29 1510)  Varicella: Immune  GTT Early:               Third trimester:  RPR: Non Reactive (06/29 1510)   Rhogam  HBsAg: Negative (06/29 1510)   TDaP vaccine                       Flu Shot: HIV: Non Reactive (06/29 1510)   Baby Food                                GBS:   Contraception  Pap:  CBB     CS/VBAC    Support Person                   Preterm labor symptoms and general obstetric precautions including but not limited to vaginal bleeding, contractions, leaking of fluid and fetal movement were reviewed in detail with the patient. Please refer to After Visit Summary for other counseling recommendations.  - Discussed influenza, anatomy scan complete, 28 week labs next visit  Return in about 4 weeks (around 02/25/2017) for ROB and 28 week labs.

## 2017-01-28 NOTE — Progress Notes (Signed)
Pt reports no problems. F/u anatomy scan today.  

## 2017-02-27 ENCOUNTER — Encounter: Payer: Medicaid Other | Admitting: Maternal Newborn

## 2017-02-27 ENCOUNTER — Other Ambulatory Visit: Payer: Medicaid Other

## 2017-03-05 ENCOUNTER — Other Ambulatory Visit: Payer: Medicaid Other

## 2017-03-05 ENCOUNTER — Encounter: Payer: Self-pay | Admitting: Maternal Newborn

## 2017-03-05 ENCOUNTER — Ambulatory Visit (INDEPENDENT_AMBULATORY_CARE_PROVIDER_SITE_OTHER): Payer: Medicaid Other | Admitting: Maternal Newborn

## 2017-03-05 VITALS — BP 120/80 | Wt 235.0 lb

## 2017-03-05 DIAGNOSIS — Z3483 Encounter for supervision of other normal pregnancy, third trimester: Secondary | ICD-10-CM

## 2017-03-05 DIAGNOSIS — Z3A24 24 weeks gestation of pregnancy: Secondary | ICD-10-CM

## 2017-03-05 DIAGNOSIS — Z3482 Encounter for supervision of other normal pregnancy, second trimester: Secondary | ICD-10-CM

## 2017-03-05 DIAGNOSIS — Z3A29 29 weeks gestation of pregnancy: Secondary | ICD-10-CM

## 2017-03-05 NOTE — Progress Notes (Signed)
    Routine Prenatal Care Visit  Subjective  Anne Mendoza is a 36 y.o. [id number] at 9114w5d being seen today for ongoing prenatal care.  She is currently monitored for the following issues for this low-risk pregnancy and has Supervision of normal intrauterine pregnancy in multigravida in second trimester on their problem list.  ----------------------------------------------------------------------------------- Patient reports backache.  She also has some painful stretching sensations in her lower abdomen consistent with growth of her uterus. Contractions: Not present. Vag. Bleeding: None.  Movement: Present. Denies leaking of fluid.  ----------------------------------------------------------------------------------- The following portions of the patient's history were reviewed and updated as appropriate: allergies, current medications, past family history, past medical history, past social history, past surgical history and problem list. Problem list updated.   Objective  Blood pressure 120/80, weight 235 lb (106.6 kg), last menstrual period 08/09/2016. Pregravid weight 208 lb (94.3 kg) Total Weight Gain 27 lb (12.2 kg) Urinalysis: Urine Protein: Negative Urine Glucose: Negative  Fetal Status: Fetal Heart Rate (bpm): 155   Movement: Present     General:  Alert, oriented and cooperative. Patient is in no acute distress.  Skin: Skin is warm and dry. No rash noted.   Cardiovascular: Normal heart rate noted  Respiratory: Normal respiratory effort, no problems with respiration noted  Abdomen: Soft, gravid, appropriate for gestational age. Pain/Pressure: Absent     Pelvic:  Cervical exam deferred        Extremities: Normal range of motion.  Edema: None  Mental Status: Normal mood and affect. Normal behavior. Normal judgment and thought content.     Assessment   36 y.o. [id number] at 4114w5d, EDD 05/16/2017, by Last Menstrual Period presenting for routine prenatal visit.  Plan   pregnancy  Problems (from 10/12/16 to present)    Problem Noted Resolved   Supervision of normal intrauterine pregnancy in multigravida in second trimester 10/12/2016 by Conard NovakJackson, Stephen D, MD No   Overview Addendum 12/24/2016  9:57 PM by Vena AustriaStaebler, Andreas, MD    Clinic [facility] Prenatal Labs  Dating LMP = 10 wk Blood type: O/Positive/-- (06/29 1510)   Genetic Screen declined   msAFP:      Antibody:Negative (06/29 1510)  Anatomic US Female, incomplete for 4-chamber and PCI 12/21/16 Rubella: 1.64 (06/29 1510)  Varicella: Immune  GTT Early:               Third trimester:  RPR: Non Reactive (06/29 1510)   Rhogam  HBsAg: Negative (06/29 1510)   TDaP vaccine                       Flu Shot: HIV: Non Reactive (06/29 1510)   Baby Food                                GBS:   Contraception  Pap:  CBB     CS/VBAC    Support Person                28 weeks labs and GTT done today.  Preterm labor symptoms and general obstetric precautions including but not limited to vaginal bleeding, contractions, leaking of fluid and fetal movement were reviewed in detail with the patient.  Please refer to After Visit Summary for other counseling recommendations.   Return in about 2 weeks (around 03/19/2017) for ROB.  Anne Mendoza, CNM 03/05/2017  1:14 PM

## 2017-03-05 NOTE — Patient Instructions (Signed)
Third Trimester of Pregnancy The third trimester is from week 28 through week 40 (months 7 through 9). The third trimester is a time when the unborn baby (fetus) is growing rapidly. At the end of the ninth month, the fetus is about 20 inches in length and weighs 6-10 pounds. Body changes during your third trimester Your body will continue to go through many changes during pregnancy. The changes vary from woman to woman. During the third trimester:  Your weight will continue to increase. You can expect to gain 25-35 pounds (11-16 kg) by the end of the pregnancy.  You may begin to get stretch marks on your hips, abdomen, and breasts.  You may urinate more often because the fetus is moving lower into your pelvis and pressing on your bladder.  You may develop or continue to have heartburn. This is caused by increased hormones that slow down muscles in the digestive tract.  You may develop or continue to have constipation because increased hormones slow digestion and cause the muscles that push waste through your intestines to relax.  You may develop hemorrhoids. These are swollen veins (varicose veins) in the rectum that can itch or be painful.  You may develop swollen, bulging veins (varicose veins) in your legs.  You may have increased body aches in the pelvis, back, or thighs. This is due to weight gain and increased hormones that are relaxing your joints.  You may have changes in your hair. These can include thickening of your hair, rapid growth, and changes in texture. Some women also have hair loss during or after pregnancy, or hair that feels dry or thin. Your hair will most likely return to normal after your baby is born.  Your breasts will continue to grow and they will continue to become tender. A yellow fluid (colostrum) may leak from your breasts. This is the first milk you are producing for your baby.  Your belly button may stick out.  You may notice more swelling in your hands,  face, or ankles.  You may have increased tingling or numbness in your hands, arms, and legs. The skin on your belly may also feel numb.  You may feel short of breath because of your expanding uterus.  You may have more problems sleeping. This can be caused by the size of your belly, increased need to urinate, and an increase in your body's metabolism.  You may notice the fetus "dropping," or moving lower in your abdomen (lightening).  You may have increased vaginal discharge.  You may notice your joints feel loose and you may have pain around your pelvic bone.  What to expect at prenatal visits You will have prenatal exams every 2 weeks until week 36. Then you will have weekly prenatal exams. During a routine prenatal visit:  You will be weighed to make sure you and the baby are growing normally.  Your blood pressure will be taken.  Your abdomen will be measured to track your baby's growth.  The fetal heartbeat will be listened to.  Any test results from the previous visit will be discussed.  You may have a cervical check near your due date to see if your cervix has softened or thinned (effaced).  You will be tested for Group B streptococcus. This happens between 35 and 37 weeks.  Your health care provider may ask you:  What your birth plan is.  How you are feeling.  If you are feeling the baby move.  If you have had   any abnormal symptoms, such as leaking fluid, bleeding, severe headaches, or abdominal cramping.  If you are using any tobacco products, including cigarettes, chewing tobacco, and electronic cigarettes.  If you have any questions.  Other tests or screenings that may be performed during your third trimester include:  Blood tests that check for low iron levels (anemia).  Fetal testing to check the health, activity level, and growth of the fetus. Testing is done if you have certain medical conditions or if there are problems during the  pregnancy.  Nonstress test (NST). This test checks the health of your baby to make sure there are no signs of problems, such as the baby not getting enough oxygen. During this test, a belt is placed around your belly. The baby is made to move, and its heart rate is monitored during movement.  What is false labor? False labor is a condition in which you feel small, irregular tightenings of the muscles in the womb (contractions) that usually go away with rest, changing position, or drinking water. These are called Braxton Hicks contractions. Contractions may last for hours, days, or even weeks before true labor sets in. If contractions come at regular intervals, become more frequent, increase in intensity, or become painful, you should see your health care provider. What are the signs of labor?  Abdominal cramps.  Regular contractions that start at 10 minutes apart and become stronger and more frequent with time.  Contractions that start on the top of the uterus and spread down to the lower abdomen and back.  Increased pelvic pressure and dull back pain.  A watery or bloody mucus discharge that comes from the vagina.  Leaking of amniotic fluid. This is also known as your "water breaking." It could be a slow trickle or a gush. Let your health care provider know if it has a color or strange odor. If you have any of these signs, call your health care provider right away, even if it is before your due date. Follow these instructions at home: Medicines  Follow your health care provider's instructions regarding medicine use. Specific medicines may be either safe or unsafe to take during pregnancy.  Take a prenatal vitamin that contains at least 600 micrograms (mcg) of folic acid.  If you develop constipation, try taking a stool softener if your health care provider approves. Eating and drinking  Eat a balanced diet that includes fresh fruits and vegetables, whole grains, good sources of protein  such as meat, eggs, or tofu, and low-fat dairy. Your health care provider will help you determine the amount of weight gain that is right for you.  Avoid raw meat and uncooked cheese. These carry germs that can cause birth defects in the baby.  If you have low calcium intake from food, talk to your health care provider about whether you should take a daily calcium supplement.  Eat four or five small meals rather than three large meals a day.  Limit foods that are high in fat and processed sugars, such as fried and sweet foods.  To prevent constipation: ? Drink enough fluid to keep your urine clear or pale yellow. ? Eat foods that are high in fiber, such as fresh fruits and vegetables, whole grains, and beans. Activity  Exercise only as directed by your health care provider. Most women can continue their usual exercise routine during pregnancy. Try to exercise for 30 minutes at least 5 days a week. Stop exercising if you experience uterine contractions.  Avoid heavy   lifting.  Do not exercise in extreme heat or humidity, or at high altitudes.  Wear low-heel, comfortable shoes.  Practice good posture.  You may continue to have sex unless your health care provider tells you otherwise. Relieving pain and discomfort  Take frequent breaks and rest with your legs elevated if you have leg cramps or low back pain.  Take warm sitz baths to soothe any pain or discomfort caused by hemorrhoids. Use hemorrhoid cream if your health care provider approves.  Wear a good support bra to prevent discomfort from breast tenderness.  If you develop varicose veins: ? Wear support pantyhose or compression stockings as told by your healthcare provider. ? Elevate your feet for 15 minutes, 3-4 times a day. Prenatal care  Write down your questions. Take them to your prenatal visits.  Keep all your prenatal visits as told by your health care provider. This is important. Safety  Wear your seat belt at  all times when driving.  Make a list of emergency phone numbers, including numbers for family, friends, the hospital, and police and fire departments. General instructions  Avoid cat litter boxes and soil used by cats. These carry germs that can cause birth defects in the baby. If you have a cat, ask someone to clean the litter box for you.  Do not travel far distances unless it is absolutely necessary and only with the approval of your health care provider.  Do not use hot tubs, steam rooms, or saunas.  Do not drink alcohol.  Do not use any products that contain nicotine or tobacco, such as cigarettes and e-cigarettes. If you need help quitting, ask your health care provider.  Do not use any medicinal herbs or unprescribed drugs. These chemicals affect the formation and growth of the baby.  Do not douche or use tampons or scented sanitary pads.  Do not cross your legs for long periods of time.  To prepare for the arrival of your baby: ? Take prenatal classes to understand, practice, and ask questions about labor and delivery. ? Make a trial run to the hospital. ? Visit the hospital and tour the maternity area. ? Arrange for maternity or paternity leave through employers. ? Arrange for family and friends to take care of pets while you are in the hospital. ? Purchase a rear-facing car seat and make sure you know how to install it in your car. ? Pack your hospital bag. ? Prepare the baby's nursery. Make sure to remove all pillows and stuffed animals from the baby's crib to prevent suffocation.  Visit your dentist if you have not gone during your pregnancy. Use a soft toothbrush to brush your teeth and be gentle when you floss. Contact a health care provider if:  You are unsure if you are in labor or if your water has broken.  You become dizzy.  You have mild pelvic cramps, pelvic pressure, or nagging pain in your abdominal area.  You have lower back pain.  You have persistent  nausea, vomiting, or diarrhea.  You have an unusual or bad smelling vaginal discharge.  You have pain when you urinate. Get help right away if:  Your water breaks before 37 weeks.  You have regular contractions less than 5 minutes apart before 37 weeks.  You have a fever.  You are leaking fluid from your vagina.  You have spotting or bleeding from your vagina.  You have severe abdominal pain or cramping.  You have rapid weight loss or weight gain.    You have shortness of breath with chest pain.  You notice sudden or extreme swelling of your face, hands, ankles, feet, or legs.  Your baby makes fewer than 10 movements in 2 hours.  You have severe headaches that do not go away when you take medicine.  You have vision changes. Summary  The third trimester is from week 28 through week 40, months 7 through 9. The third trimester is a time when the unborn baby (fetus) is growing rapidly.  During the third trimester, your discomfort may increase as you and your baby continue to gain weight. You may have abdominal, leg, and back pain, sleeping problems, and an increased need to urinate.  During the third trimester your breasts will keep growing and they will continue to become tender. A yellow fluid (colostrum) may leak from your breasts. This is the first milk you are producing for your baby.  False labor is a condition in which you feel small, irregular tightenings of the muscles in the womb (contractions) that eventually go away. These are called Braxton Hicks contractions. Contractions may last for hours, days, or even weeks before true labor sets in.  Signs of labor can include: abdominal cramps; regular contractions that start at 10 minutes apart and become stronger and more frequent with time; watery or bloody mucus discharge that comes from the vagina; increased pelvic pressure and dull back pain; and leaking of amniotic fluid. This information is not intended to replace advice  given to you by your health care provider. Make sure you discuss any questions you have with your health care provider. Document Released: 03/27/2001 Document Revised: 09/08/2015 Document Reviewed: 06/03/2012 Elsevier Interactive Patient Education  2017 [employer]  

## 2017-03-06 ENCOUNTER — Encounter: Payer: Self-pay | Admitting: Obstetrics and Gynecology

## 2017-03-06 LAB — 28 WEEK RH+PANEL
BASOS: 0 %
Basophils Absolute: 0 10*3/uL (ref 0.0–0.2)
EOS (ABSOLUTE): 0.1 10*3/uL (ref 0.0–0.4)
Eos: 1 %
Gestational Diabetes Screen: 127 mg/dL (ref 65–139)
HIV Screen 4th Generation wRfx: NONREACTIVE
Hematocrit: 34.8 % (ref 34.0–46.6)
Hemoglobin: 11.8 g/dL (ref 11.1–15.9)
IMMATURE GRANS (ABS): 0.1 10*3/uL (ref 0.0–0.1)
IMMATURE GRANULOCYTES: 1 %
LYMPHS: 18 %
Lymphocytes Absolute: 2.4 10*3/uL (ref 0.7–3.1)
MCH: 29.5 pg (ref 26.6–33.0)
MCHC: 33.9 g/dL (ref 31.5–35.7)
MCV: 87 fL (ref 79–97)
MONOS ABS: 0.7 10*3/uL (ref 0.1–0.9)
Monocytes: 6 %
NEUTROS PCT: 74 %
Neutrophils Absolute: 9.7 10*3/uL — ABNORMAL HIGH (ref 1.4–7.0)
Platelets: 306 10*3/uL (ref 150–379)
RBC: 4 x10E6/uL (ref 3.77–5.28)
RDW: 13.7 % (ref 12.3–15.4)
RPR Ser Ql: NONREACTIVE
WBC: 12.9 10*3/uL — ABNORMAL HIGH (ref 3.4–10.8)

## 2017-03-13 ENCOUNTER — Encounter: Payer: Self-pay | Admitting: Maternal Newborn

## 2017-03-19 ENCOUNTER — Encounter: Payer: Medicaid Other | Admitting: Advanced Practice Midwife

## 2017-03-21 ENCOUNTER — Encounter: Payer: Self-pay | Admitting: Advanced Practice Midwife

## 2017-03-22 ENCOUNTER — Encounter: Payer: Self-pay | Admitting: Advanced Practice Midwife

## 2017-03-22 ENCOUNTER — Ambulatory Visit (INDEPENDENT_AMBULATORY_CARE_PROVIDER_SITE_OTHER): Payer: Medicaid Other | Admitting: Advanced Practice Midwife

## 2017-03-22 ENCOUNTER — Observation Stay
Admission: EM | Admit: 2017-03-22 | Discharge: 2017-03-22 | Disposition: A | Discharge status: Home / Self Care | Payer: Medicaid Other | Attending: Obstetrics and Gynecology | Admitting: Obstetrics and Gynecology

## 2017-03-22 VITALS — BP 140/80 | Wt 245.0 lb

## 2017-03-22 DIAGNOSIS — O163 Unspecified maternal hypertension, third trimester: Principal | ICD-10-CM | POA: Insufficient documentation

## 2017-03-22 DIAGNOSIS — Z3A32 32 weeks gestation of pregnancy: Secondary | ICD-10-CM

## 2017-03-22 DIAGNOSIS — E7800 Pure hypercholesterolemia, unspecified: Secondary | ICD-10-CM | POA: Diagnosis present

## 2017-03-22 LAB — COMPREHENSIVE METABOLIC PANEL
ALK PHOS: 116 U/L (ref 38–126)
ALT: 10 U/L — AB (ref 14–54)
AST: 17 U/L (ref 15–41)
Albumin: 2.7 g/dL — ABNORMAL LOW (ref 3.5–5.0)
Anion gap: 8 (ref 5–15)
BUN: 8 mg/dL (ref 6–20)
CALCIUM: 9.4 mg/dL (ref 8.9–10.3)
CO2: 21 mmol/L — ABNORMAL LOW (ref 22–32)
CREATININE: 0.77 mg/dL (ref 0.44–1.00)
Chloride: 106 mmol/L (ref 101–111)
Glucose, Bld: 97 mg/dL (ref 65–99)
Potassium: 3.7 mmol/L (ref 3.5–5.1)
Sodium: 135 mmol/L (ref 135–145)
Total Bilirubin: 0.5 mg/dL (ref 0.3–1.2)
Total Protein: 6.3 g/dL — ABNORMAL LOW (ref 6.5–8.1)

## 2017-03-22 LAB — CBC
HCT: 33.8 % — ABNORMAL LOW (ref 35.0–47.0)
Hemoglobin: 11.4 g/dL — ABNORMAL LOW (ref 12.0–16.0)
MCH: 29.7 pg (ref 26.0–34.0)
MCHC: 33.9 g/dL (ref 32.0–36.0)
MCV: 87.8 fL (ref 80.0–100.0)
PLATELETS: 275 10*3/uL (ref 150–440)
RBC: 3.85 MIL/uL (ref 3.80–5.20)
RDW: 13 % (ref 11.5–14.5)
WBC: 13.6 10*3/uL — AB (ref 3.6–11.0)

## 2017-03-22 LAB — PROTEIN / CREATININE RATIO, URINE
Creatinine, Urine: 50 mg/dL
Protein Creatinine Ratio: 0.14 mg/mg{Cre} (ref 0.00–0.15)
Total Protein, Urine: 7 mg/dL

## 2017-03-22 MED ORDER — ZOLPIDEM TARTRATE 5 MG PO TABS: 5.0000 mg | ORAL_TABLET | Freq: Every evening | ORAL | 1 refills | Status: DC | PRN

## 2017-03-22 MED ORDER — HYDRALAZINE HCL 20 MG/ML IJ SOLN: 10.0000 mg | Freq: Once | INTRAMUSCULAR | Status: DC | PRN

## 2017-03-22 MED ORDER — LABETALOL HCL 5 MG/ML IV SOLN: 20.0000 mg | INTRAVENOUS | Status: DC | PRN

## 2017-03-22 NOTE — OB Triage Note (Signed)
Patient arrived on unit via wheelchair from ED.  Patient sent from provider's office today for evaluation.

## 2017-03-22 NOTE — Progress Notes (Signed)
Discharge teaching provided to pt and s/o. Explained BP parameters to report and s/s of pree to monitor and report if evident. Pt and s/o verbalized understanding. Pt denies any headache, vision changes, chest pain, SOB and epigastric pain has resolved. Still feels some abd tightening and mild discomfort when baby moves. VSS, afebrile. Pt agrees with plan for discharge home. Encouraged rest, warm shower and stay well hydrated. Pt provided with rx for Ambien as discussed with provider, advised to rest and not drive after taking med. Understanding verbalized.

## 2017-03-22 NOTE — Progress Notes (Signed)
Pt states she messaged JEG regarding issues

## 2017-03-22 NOTE — Progress Notes (Signed)
  Routine Prenatal Care Visit  Subjective  Anne MoundJohanna R Gailey is a 36 y.o. [phone] at 6986w1d being seen today for ongoing prenatal care.  She is currently monitored for the following issues for this low-risk pregnancy and has Supervision of other normal pregnancy, antepartum on their problem list.  ----------------------------------------------------------------------------------- Patient reports pelvic pressure and abdominal discomfort.  She requests cervical exam today. Contractions: Not present. Vag. Bleeding: None.  Movement: Present. Denies leaking of fluid.  ----------------------------------------------------------------------------------- The following portions of the patient's history were reviewed and updated as appropriate: allergies, current medications, past family history, past medical history, past social history, past surgical history and problem list. Problem list updated.   Objective  Blood pressure 140/80, weight 245 lb (111.1 kg), last menstrual period 08/09/2016 Pregravid weight 208 lb (94.3 kg) Total Weight Gain 37 lb (16.8 kg)  Repeat blood pressure: 140/80  Urinalysis: negative protein, negative glucose Fetal Status: Fetal Heart Rate (bpm): 132 Fundal Height: 32 cm Movement: Present     General:  Alert, oriented and cooperative. Patient is in no acute distress.  Skin: Skin is warm and dry. No rash noted.   Cardiovascular: Normal heart rate noted  Respiratory: Normal respiratory effort, no problems with respiration noted  Abdomen: Soft, gravid, appropriate for gestational age. Pain/Pressure: Present     Pelvic:  Cervical exam performed Dilation: Fingertip Effacement (%): 20 Station: Ballotable  Extremities: Normal range of motion.  Edema: Trace  Mental Status: Normal mood and affect. Normal behavior. Normal judgment and thought content.   Assessment   36 y.o. [id number] at 3286w1d by  05/16/2017, by Last Menstrual Period presenting for routine prenatal visit  Plan    pregnancy Problems (from 10/12/16 to present)    Problem Noted Resolved   Supervision of other normal pregnancy, antepartum 10/12/2016 by Conard NovakJackson, Stephen D, MD No   Overview Addendum 03/06/2017  8:52 AM by Vena AustriaStaebler, Andreas, MD    Clinic [facility] Prenatal Labs  Dating LMP = 10 wk Blood type: O/Positive/-- (06/29 1510)   Genetic Screen declined   msAFP:      Antibody:Negative (06/29 1510)  Anatomic US Female, incomplete for 4-chamber and PCI 12/21/16 Rubella: 1.64 (06/29 1510)  Varicella: Immune  GTT Third trimester: 129 RPR: Non Reactive (06/29 1510)   Rhogam  HBsAg: Negative (06/29 1510)   TDaP vaccine                       Flu Shot: HIV: Non Reactive (06/29 1510)   Baby Food                                GBS:   Contraception  Pap:  CBB     CS/VBAC    Support Person                  Recommend patient [facility]  Preterm labor symptoms and general obstetric precautions including but not limited to vaginal bleeding, contractions, leaking of fluid and fetal movement were reviewed in detail with the patient. Please refer to After Visit Summary for other counseling recommendations.    Return in about 2 weeks (around 04/05/2017) for rob.  Tresea MallJane Keyshawn Hellwig, CNM  03/22/2017 4:08 PM

## 2017-03-22 NOTE — Patient Instructions (Signed)
Third Trimester of Pregnancy The third trimester is from week 28 through week 40 (months 7 through 9). The third trimester is a time when the unborn baby (fetus) is growing rapidly. At the end of the ninth month, the fetus is about 20 inches in length and weighs 6-10 pounds. Body changes during your third trimester Your body will continue to go through many changes during pregnancy. The changes vary from woman to woman. During the third trimester:  Your weight will continue to increase. You can expect to gain 25-35 pounds (11-16 kg) by the end of the pregnancy.  You may begin to get stretch marks on your hips, abdomen, and breasts.  You may urinate more often because the fetus is moving lower into your pelvis and pressing on your bladder.  You may develop or continue to have heartburn. This is caused by increased hormones that slow down muscles in the digestive tract.  You may develop or continue to have constipation because increased hormones slow digestion and cause the muscles that push waste through your intestines to relax.  You may develop hemorrhoids. These are swollen veins (varicose veins) in the rectum that can itch or be painful.  You may develop swollen, bulging veins (varicose veins) in your legs.  You may have increased body aches in the pelvis, back, or thighs. This is due to weight gain and increased hormones that are relaxing your joints.  You may have changes in your hair. These can include thickening of your hair, rapid growth, and changes in texture. Some women also have hair loss during or after pregnancy, or hair that feels dry or thin. Your hair will most likely return to normal after your baby is born.  Your breasts will continue to grow and they will continue to become tender. A yellow fluid (colostrum) may leak from your breasts. This is the first milk you are producing for your baby.  Your belly button may stick out.  You may notice more swelling in your hands,  face, or ankles.  You may have increased tingling or numbness in your hands, arms, and legs. The skin on your belly may also feel numb.  You may feel short of breath because of your expanding uterus.  You may have more problems sleeping. This can be caused by the size of your belly, increased need to urinate, and an increase in your body's metabolism.  You may notice the fetus "dropping," or moving lower in your abdomen (lightening).  You may have increased vaginal discharge.  You may notice your joints feel loose and you may have pain around your pelvic bone.  What to expect at prenatal visits You will have prenatal exams every 2 weeks until week 36. Then you will have weekly prenatal exams. During a routine prenatal visit:  You will be weighed to make sure you and the baby are growing normally.  Your blood pressure will be taken.  Your abdomen will be measured to track your baby's growth.  The fetal heartbeat will be listened to.  Any test results from the previous visit will be discussed.  You may have a cervical check near your due date to see if your cervix has softened or thinned (effaced).  You will be tested for Group B streptococcus. This happens between 35 and 37 weeks.  Your health care provider may ask you:  What your birth plan is.  How you are feeling.  If you are feeling the baby move.  If you have had   any abnormal symptoms, such as leaking fluid, bleeding, severe headaches, or abdominal cramping.  If you are using any tobacco products, including cigarettes, chewing tobacco, and electronic cigarettes.  If you have any questions.  Other tests or screenings that may be performed during your third trimester include:  Blood tests that check for low iron levels (anemia).  Fetal testing to check the health, activity level, and growth of the fetus. Testing is done if you have certain medical conditions or if there are problems during the  pregnancy.  Nonstress test (NST). This test checks the health of your baby to make sure there are no signs of problems, such as the baby not getting enough oxygen. During this test, a belt is placed around your belly. The baby is made to move, and its heart rate is monitored during movement.  What is false labor? False labor is a condition in which you feel small, irregular tightenings of the muscles in the womb (contractions) that usually go away with rest, changing position, or drinking water. These are called Braxton Hicks contractions. Contractions may last for hours, days, or even weeks before true labor sets in. If contractions come at regular intervals, become more frequent, increase in intensity, or become painful, you should see your health care provider. What are the signs of labor?  Abdominal cramps.  Regular contractions that start at 10 minutes apart and become stronger and more frequent with time.  Contractions that start on the top of the uterus and spread down to the lower abdomen and back.  Increased pelvic pressure and dull back pain.  A watery or bloody mucus discharge that comes from the vagina.  Leaking of amniotic fluid. This is also known as your "water breaking." It could be a slow trickle or a gush. Let your health care provider know if it has a color or strange odor. If you have any of these signs, call your health care provider right away, even if it is before your due date. Follow these instructions at home: Medicines  Follow your health care provider's instructions regarding medicine use. Specific medicines may be either safe or unsafe to take during pregnancy.  Take a prenatal vitamin that contains at least 600 micrograms (mcg) of folic acid.  If you develop constipation, try taking a stool softener if your health care provider approves. Eating and drinking  Eat a balanced diet that includes fresh fruits and vegetables, whole grains, good sources of protein  such as meat, eggs, or tofu, and low-fat dairy. Your health care provider will help you determine the amount of weight gain that is right for you.  Avoid raw meat and uncooked cheese. These carry germs that can cause birth defects in the baby.  If you have low calcium intake from food, talk to your health care provider about whether you should take a daily calcium supplement.  Eat four or five small meals rather than three large meals a day.  Limit foods that are high in fat and processed sugars, such as fried and sweet foods.  To prevent constipation: ? Drink enough fluid to keep your urine clear or pale yellow. ? Eat foods that are high in fiber, such as fresh fruits and vegetables, whole grains, and beans. Activity  Exercise only as directed by your health care provider. Most women can continue their usual exercise routine during pregnancy. Try to exercise for 30 minutes at least 5 days a week. Stop exercising if you experience uterine contractions.  Avoid heavy   lifting.  Do not exercise in extreme heat or humidity, or at high altitudes.  Wear low-heel, comfortable shoes.  Practice good posture.  You may continue to have sex unless your health care provider tells you otherwise. Relieving pain and discomfort  Take frequent breaks and rest with your legs elevated if you have leg cramps or low back pain.  Take warm sitz baths to soothe any pain or discomfort caused by hemorrhoids. Use hemorrhoid cream if your health care provider approves.  Wear a good support bra to prevent discomfort from breast tenderness.  If you develop varicose veins: ? Wear support pantyhose or compression stockings as told by your healthcare provider. ? Elevate your feet for 15 minutes, 3-4 times a day. Prenatal care  Write down your questions. Take them to your prenatal visits.  Keep all your prenatal visits as told by your health care provider. This is important. Safety  Wear your seat belt at  all times when driving.  Make a list of emergency phone numbers, including numbers for family, friends, the hospital, and police and fire departments. General instructions  Avoid cat litter boxes and soil used by cats. These carry germs that can cause birth defects in the baby. If you have a cat, ask someone to clean the litter box for you.  Do not travel far distances unless it is absolutely necessary and only with the approval of your health care provider.  Do not use hot tubs, steam rooms, or saunas.  Do not drink alcohol.  Do not use any products that contain nicotine or tobacco, such as cigarettes and e-cigarettes. If you need help quitting, ask your health care provider.  Do not use any medicinal herbs or unprescribed drugs. These chemicals affect the formation and growth of the baby.  Do not douche or use tampons or scented sanitary pads.  Do not cross your legs for long periods of time.  To prepare for the arrival of your baby: ? Take prenatal classes to understand, practice, and ask questions about labor and delivery. ? Make a trial run to the hospital. ? Visit the hospital and tour the maternity area. ? Arrange for maternity or paternity leave through employers. ? Arrange for family and friends to take care of pets while you are in the hospital. ? Purchase a rear-facing car seat and make sure you know how to install it in your car. ? Pack your hospital bag. ? Prepare the baby's nursery. Make sure to remove all pillows and stuffed animals from the baby's crib to prevent suffocation.  Visit your dentist if you have not gone during your pregnancy. Use a soft toothbrush to brush your teeth and be gentle when you floss. Contact a health care provider if:  You are unsure if you are in labor or if your water has broken.  You become dizzy.  You have mild pelvic cramps, pelvic pressure, or nagging pain in your abdominal area.  You have lower back pain.  You have persistent  nausea, vomiting, or diarrhea.  You have an unusual or bad smelling vaginal discharge.  You have pain when you urinate. Get help right away if:  Your water breaks before 37 weeks.  You have regular contractions less than 5 minutes apart before 37 weeks.  You have a fever.  You are leaking fluid from your vagina.  You have spotting or bleeding from your vagina.  You have severe abdominal pain or cramping.  You have rapid weight loss or weight gain.    You have shortness of breath with chest pain.  You notice sudden or extreme swelling of your face, hands, ankles, feet, or legs.  Your baby makes fewer than 10 movements in 2 hours.  You have severe headaches that do not go away when you take medicine.  You have vision changes. Summary  The third trimester is from week 28 through week 40, months 7 through 9. The third trimester is a time when the unborn baby (fetus) is growing rapidly.  During the third trimester, your discomfort may increase as you and your baby continue to gain weight. You may have abdominal, leg, and back pain, sleeping problems, and an increased need to urinate.  During the third trimester your breasts will keep growing and they will continue to become tender. A yellow fluid (colostrum) may leak from your breasts. This is the first milk you are producing for your baby.  False labor is a condition in which you feel small, irregular tightenings of the muscles in the womb (contractions) that eventually go away. These are called Braxton Hicks contractions. Contractions may last for hours, days, or even weeks before true labor sets in.  Signs of labor can include: abdominal cramps; regular contractions that start at 10 minutes apart and become stronger and more frequent with time; watery or bloody mucus discharge that comes from the vagina; increased pelvic pressure and dull back pain; and leaking of amniotic fluid. This information is not intended to replace advice  given to you by your health care provider. Make sure you discuss any questions you have with your health care provider. Document Released: 03/27/2001 Document Revised: 09/08/2015 Document Reviewed: 06/03/2012 Elsevier Interactive Patient Education  2017 [employer]  

## 2017-03-22 NOTE — OB Triage Note (Signed)
Lab results reviewed with pt and s/o, wnl

## 2017-03-25 ENCOUNTER — Encounter: Payer: Self-pay | Admitting: Obstetrics and Gynecology

## 2017-03-25 ENCOUNTER — Other Ambulatory Visit: Payer: Self-pay | Admitting: Obstetrics and Gynecology

## 2017-03-25 DIAGNOSIS — O163 Unspecified maternal hypertension, third trimester: Secondary | ICD-10-CM

## 2017-03-25 DIAGNOSIS — Z3480 Encounter for supervision of other normal pregnancy, unspecified trimester: Secondary | ICD-10-CM

## 2017-03-25 NOTE — Discharge Summary (Signed)
See final progress note. 

## 2017-03-25 NOTE — Final Progress Note (Signed)
Physician Final Progress Note  Patient ID: Anne Mendoza MRN: [id number] DOB/AGE: 1980/12/23 36 y.o.  Admit date: 03/22/2017 Admitting provider: Malachy Mood, MD Discharge date: 03/25/2017   Admission Diagnoses: Elevated clinic blood pressure reading  Discharge Diagnoses:  Active Problems:   Elevated blood pressure affecting pregnancy in third trimester, antepartum   36 yo [id number] at 67w4dpresenting in clinic today for routine prenatal visit with systolic elevated to 1314  On initial presentation to L&D also some mild range BP's in 140's but then normotensive for remainder of evaluation.  Asymptomatic with normal PIH panel labs.  The patient was asymptomatic without headaches, vision changes, RUQ or epigastric pain. She does have history of blood pressure elevations in the third trimester with prior pregnancies. No contractions, LOF, or VB.  +FM  Consults: None  Significant Findings/ Diagnostic Studies: Results for orders placed or performed during the hospital encounter of 03/22/17 (from the past 72 hour(s))  Protein / creatinine ratio, urine     Status: None   Collection Time: 03/22/17  6:29 PM  Result Value Ref Range   Creatinine, Urine 50 mg/dL   Total Protein, Urine 7 mg/dL    Comment: NO NORMAL RANGE ESTABLISHED FOR THIS TEST   Protein Creatinine Ratio 0.14 0.00 - 0.15 mg/mg[Cre]  Comprehensive metabolic panel     Status: Abnormal   Collection Time: 03/22/17  6:50 PM  Result Value Ref Range   Sodium 135 135 - 145 mmol/L   Potassium 3.7 3.5 - 5.1 mmol/L   Chloride 106 101 - 111 mmol/L   CO2 21 (L) 22 - 32 mmol/L   Glucose, Bld 97 65 - 99 mg/dL   BUN 8 6 - 20 mg/dL   Creatinine, Ser 0.77 0.44 - 1.00 mg/dL   Calcium 9.4 8.9 - 10.3 mg/dL   Total Protein 6.3 (L) 6.5 - 8.1 g/dL   Albumin 2.7 (L) 3.5 - 5.0 g/dL   AST 17 15 - 41 U/L   ALT 10 (L) 14 - 54 U/L   Alkaline Phosphatase 116 38 - 126 U/L   Total Bilirubin 0.5 0.3 - 1.2 mg/dL   GFR calc non Af Amer >60 >60  mL/min   GFR calc Af Amer >60 >60 mL/min    Comment: (NOTE) The eGFR has been calculated using the CKD EPI equation. This calculation has not been validated in all clinical situations. eGFR's persistently <60 mL/min signify possible Chronic Kidney Disease.    Anion gap 8 5 - 15  CBC     Status: Abnormal   Collection Time: 03/22/17  6:50 PM  Result Value Ref Range   WBC 13.6 (H) 3.6 - 11.0 K/uL   RBC 3.85 3.80 - 5.20 MIL/uL   Hemoglobin 11.4 (L) 12.0 - 16.0 g/dL   HCT 33.8 (L) 35.0 - 47.0 %   MCV 87.8 80.0 - 100.0 fL   MCH 29.7 26.0 - 34.0 pg   MCHC 33.9 32.0 - 36.0 g/dL   RDW 13.0 11.5 - 14.5 %   Platelets 275 150 - 440 K/uL    Procedures:  Baseline: 145 Variability: moderate Accelerations: present Decelerations: absent Tocometry: irregular The patient was monitored for 30 minutes, fetal heart rate tracing was deemed reactive, category I tracing,  CPT [id number] Discharge Condition: good  Disposition: 01-Home or Self Care  Diet: Cardiac diet  Discharge Activity: Activity as tolerated  Discharge Instructions    Discharge activity:  No Restrictions   Complete by:  As directed    Discharge  diet:  No restrictions   Complete by:  As directed    No sexual activity restrictions   Complete by:  As directed    Notify physician for a general feeling that "something is not right"   Complete by:  As directed    Notify physician for increase or change in vaginal discharge   Complete by:  As directed    Notify physician for intestinal cramps, with or without diarrhea, sometimes described as "gas pain"   Complete by:  As directed    Notify physician for leaking of fluid   Complete by:  As directed    Notify physician for low, dull backache, unrelieved by heat or Tylenol   Complete by:  As directed    Notify physician for menstrual like cramps   Complete by:  As directed    Notify physician for pelvic pressure   Complete by:  As directed    Notify physician for uterine  contractions.  These may be painless and feel like the uterus is tightening or the baby is  "balling up"   Complete by:  As directed    Notify physician for vaginal bleeding   Complete by:  As directed    PRETERM LABOR:  Includes any of the follwing symptoms that occur between 20 - [redacted] weeks gestation.  If these symptoms are not stopped, preterm labor can result in preterm delivery, placing your baby at risk   Complete by:  As directed      Allergies as of 03/22/2017   No Known Allergies     Medication List    TAKE these medications   VITAFOL GUMMIES 3.33-0.333-34.8 MG Chew Chew 3 tablets by mouth daily.   zolpidem 5 MG tablet Commonly known as:  AMBIEN Take 1 tablet (5 mg total) by mouth at bedtime as needed for sleep.        Total time spent taking care of this patient: 30 minutes  Signed: Malachy Mood 03/25/2017, 1:45 PM

## 2017-03-29 ENCOUNTER — Encounter: Payer: Self-pay | Admitting: Obstetrics and Gynecology

## 2017-03-29 ENCOUNTER — Ambulatory Visit (INDEPENDENT_AMBULATORY_CARE_PROVIDER_SITE_OTHER): Payer: Medicaid Other | Admitting: Obstetrics and Gynecology

## 2017-03-29 VITALS — BP 146/86 | Wt 248.0 lb

## 2017-03-29 DIAGNOSIS — Z3480 Encounter for supervision of other normal pregnancy, unspecified trimester: Secondary | ICD-10-CM | POA: Diagnosis not present

## 2017-03-29 DIAGNOSIS — O163 Unspecified maternal hypertension, third trimester: Secondary | ICD-10-CM

## 2017-03-29 DIAGNOSIS — Z3A33 33 weeks gestation of pregnancy: Secondary | ICD-10-CM

## 2017-03-29 LAB — FETAL NONSTRESS TEST

## 2017-03-29 NOTE — Progress Notes (Signed)
Routine Prenatal Care Visit Subjective  Anne MoundJohanna R Sattler is a 36 y.o. [phone] at 4025w1d being seen today for ongoing prenatal care.  She is currently monitored for the following issues for this high-risk pregnancy and has Supervision of other normal pregnancy, antepartum and Elevated blood pressure affecting pregnancy in third trimester, antepartum on their problem list.  ----------------------------------------------------------------------------------- Patient reports no complaints.  Denies HA, visual changes, and RUQ pain, Contractions: Not present. Vag. Bleeding: None.  Movement: Present. Denies leaking of fluid. ----------------------------------------------------------------------------------- The following portions of the patient's history were reviewed and updated as appropriate: allergies, current medications, past family history, past medical history, past social history, past surgical history and problem list. Problem list updated. Objective  Blood pressure (!) 146/86, weight 248 lb (112.5 kg), last menstrual period 08/09/2016, unknown if currently breastfeeding. Pregravid weight 208 lb (94.3 kg) Total Weight Gain 40 lb (18.1 kg) Urinalysis:      Fetal Status: Fetal Heart Rate (bpm): 155 Fundal Height: 33 cm Movement: Present     General:  Alert, oriented and cooperative. Patient is in no acute distress.  Skin: Skin is warm and dry. No rash noted.   Cardiovascular: Normal heart rate noted  Respiratory: Normal respiratory effort, no problems with respiration noted  Abdomen: Soft, gravid, appropriate for gestational age. Pain/Pressure: Absent     Pelvic:  Cervical exam deferred        Extremities: Normal range of motion.     Mental Status: Normal mood and affect. Normal behavior. Normal judgment and thought content.   NST Baseline FHR: 155 beats/min Variability: moderate Accelerations: present Decelerations: absent Tocometry: not done  Interpretation:  INDICATIONS:  gestational hypertension RESULTS:  A NST procedure was performed with FHR monitoring and a normal baseline established, appropriate time of 20-40 minutes of evaluation, and accels >2 seen w 15x15 characteristics.  Results show a REACTIVE NST.    Assessment   36 y.o. [id number] at 4925w1d by  05/16/2017, by Last Menstrual Period presenting for routine prenatal visit  Plan   pregnancy Problems (from 10/12/16 to present)    Problem Noted Resolved   Supervision of other normal pregnancy, antepartum 10/12/2016 by Conard NovakJackson, Cedrik Heindl D, MD No   Overview Addendum 03/06/2017  8:52 AM by Vena AustriaStaebler, Andreas, MD    Clinic [facility] Prenatal Labs  Dating LMP = 10 wk Blood type: O/Positive/-- (06/29 1510)   Genetic Screen declined   msAFP:      Antibody:Negative (06/29 1510)  Anatomic US Female, incomplete for 4-chamber and PCI 12/21/16 Rubella: 1.64 (06/29 1510)  Varicella: Immune  GTT Third trimester: 129 RPR: Non Reactive (06/29 1510)   Rhogam  HBsAg: Negative (06/29 1510)   TDaP vaccine                       Flu Shot: HIV: Non Reactive (06/29 1510)   Baby Food                                GBS:   Contraception  Pap:  CBB     CS/VBAC    Support Person                 Preterm labor symptoms and general obstetric precautions including but not limited to vaginal bleeding, contractions, leaking of fluid and fetal movement were reviewed in detail with the patient. Please refer to After Visit Summary for other counseling recommendations.   Return in about  1 week (around 04/05/2017) for schedule growth u/s and Routine prenatal with NST.  Labs ordered today.   Thomasene MohairStephen Rohith Fauth, MD  03/29/2017 3:03 PM

## 2017-03-30 LAB — CBC
HEMATOCRIT: 33.4 % — AB (ref 34.0–46.6)
Hemoglobin: 11.4 g/dL (ref 11.1–15.9)
MCH: 29.5 pg (ref 26.6–33.0)
MCHC: 34.1 g/dL (ref 31.5–35.7)
MCV: 87 fL (ref 79–97)
Platelets: 294 10*3/uL (ref 150–379)
RBC: 3.86 x10E6/uL (ref 3.77–5.28)
RDW: 13.7 % (ref 12.3–15.4)
WBC: 14.8 10*3/uL — AB (ref 3.4–10.8)

## 2017-03-30 LAB — PROTEIN / CREATININE RATIO, URINE
Creatinine, Urine: 87.4 mg/dL
PROTEIN UR: 12.5 mg/dL
Protein/Creat Ratio: 143 mg/g creat (ref 0–200)

## 2017-03-30 LAB — COMPREHENSIVE METABOLIC PANEL
A/G RATIO: 1.4 (ref 1.2–2.2)
ALT: 10 IU/L (ref 0–32)
AST: 14 IU/L (ref 0–40)
Albumin: 3.4 g/dL — ABNORMAL LOW (ref 3.5–5.5)
Alkaline Phosphatase: 127 IU/L — ABNORMAL HIGH (ref 39–117)
BUN/Creatinine Ratio: 8 — ABNORMAL LOW (ref 9–23)
BUN: 5 mg/dL — ABNORMAL LOW (ref 6–20)
Bilirubin Total: <0.2 mg/dL (ref 0.0–1.2)
CALCIUM: 9.4 mg/dL (ref 8.7–10.2)
CO2: 20 mmol/L (ref 20–29)
Chloride: 109 mmol/L — ABNORMAL HIGH (ref 96–106)
Creatinine, Ser: 0.6 mg/dL (ref 0.57–1.00)
GFR calc Af Amer: 138 mL/min/{1.73_m2} (ref >59)
GFR, EST NON AFRICAN AMERICAN: 119 mL/min/{1.73_m2} (ref >59)
GLOBULIN, TOTAL: 2.4 g/dL (ref 1.5–4.5)
Glucose: 110 mg/dL — ABNORMAL HIGH (ref 65–99)
POTASSIUM: 3.9 mmol/L (ref 3.5–5.2)
SODIUM: 141 mmol/L (ref 134–144)
Total Protein: 5.8 g/dL — ABNORMAL LOW (ref 6.0–8.5)

## 2017-04-01 ENCOUNTER — Encounter: Payer: Self-pay | Admitting: Obstetrics and Gynecology

## 2017-04-03 ENCOUNTER — Ambulatory Visit (INDEPENDENT_AMBULATORY_CARE_PROVIDER_SITE_OTHER): Payer: Medicaid Other

## 2017-04-03 DIAGNOSIS — O163 Unspecified maternal hypertension, third trimester: Secondary | ICD-10-CM | POA: Diagnosis not present

## 2017-04-03 DIAGNOSIS — Z3480 Encounter for supervision of other normal pregnancy, unspecified trimester: Secondary | ICD-10-CM | POA: Diagnosis not present

## 2017-04-05 ENCOUNTER — Encounter: Payer: Self-pay | Admitting: Obstetrics and Gynecology

## 2017-04-05 ENCOUNTER — Other Ambulatory Visit: Payer: Medicaid Other

## 2017-04-05 ENCOUNTER — Ambulatory Visit (INDEPENDENT_AMBULATORY_CARE_PROVIDER_SITE_OTHER): Payer: Medicaid Other | Admitting: Obstetrics and Gynecology

## 2017-04-05 VITALS — BP 142/90 | Wt 250.0 lb

## 2017-04-05 DIAGNOSIS — O163 Unspecified maternal hypertension, third trimester: Secondary | ICD-10-CM

## 2017-04-05 DIAGNOSIS — Z3480 Encounter for supervision of other normal pregnancy, unspecified trimester: Secondary | ICD-10-CM

## 2017-04-05 DIAGNOSIS — Z3A34 34 weeks gestation of pregnancy: Secondary | ICD-10-CM | POA: Diagnosis not present

## 2017-04-05 LAB — FETAL NONSTRESS TEST

## 2017-04-05 NOTE — Progress Notes (Signed)
Routine Prenatal Care Visit  Subjective  Anne Mendoza is a 36 y.o. [id number] at 8047w1d being seen today for ongoing prenatal care.  She is currently monitored for the following issues for this high-risk pregnancy and has Supervision of other normal pregnancy, antepartum and Elevated blood pressure affecting pregnancy in third trimester, antepartum on their problem list.  ----------------------------------------------------------------------------------- Patient reports no complaints.  No HA, visual changes, RUQ pain. Contractions: Not present. Vag. Bleeding: None.  Movement: Present. Denies leaking of fluid.  ----------------------------------------------------------------------------------- The following portions of the patient's history were reviewed and updated as appropriate: allergies, current medications, past family history, past medical history, past social history, past surgical history and problem list. Problem list updated.   Objective  Blood pressure (!) 142/90, weight 250 lb (113.4 kg), last menstrual period 08/09/2016, unknown if currently breastfeeding. Pregravid weight 208 lb (94.3 kg) Total Weight Gain 42 lb (19.1 kg) Urinalysis: Urine Protein: Negative Urine Glucose: Negative  Fetal Status: Fetal Heart Rate (bpm): 150   Movement: Present     General:  Alert, oriented and cooperative. Patient is in no acute distress.  Skin: Skin is warm and dry. No rash noted.   Cardiovascular: Normal heart rate noted  Respiratory: Normal respiratory effort, no problems with respiration noted  Abdomen: Soft, gravid, appropriate for gestational age. Pain/Pressure: Absent     Pelvic:  Cervical exam deferred        Extremities: Normal range of motion.     Mental Status: Normal mood and affect. Normal behavior. Normal judgment and thought content.   NST Baseline FHR: 145 beats/min Variability: moderate Accelerations: present Decelerations: absent Tocometry: not  done  Interpretation:  INDICATIONS: gestational hypertension RESULTS:  A NST procedure was performed with FHR monitoring and a normal baseline established, appropriate time of 20-40 minutes of evaluation, and accels >2 seen w 15x15 characteristics.  Results show a REACTIVE NST.    Assessment   36 y.o. [id number] at 6547w1d by  05/16/2017, by Last Menstrual Period presenting for routine prenatal visit  Plan   pregnancy Problems (from 10/12/16 to present)    Problem Noted Resolved   Supervision of other normal pregnancy, antepartum 10/12/2016 by Conard NovakJackson, Debhora Titus D, MD No   Overview Addendum 03/06/2017  8:52 AM by Vena AustriaStaebler, Andreas, MD    Clinic [facility] Prenatal Labs  Dating LMP = 10 wk Blood type: O/Positive/-- (06/29 1510)   Genetic Screen declined   msAFP:      Antibody:Negative (06/29 1510)  Anatomic US Female, incomplete for 4-chamber and PCI 12/21/16 Rubella: 1.64 (06/29 1510)  Varicella: Immune  GTT Third trimester: 129 RPR: Non Reactive (06/29 1510)   Rhogam  HBsAg: Negative (06/29 1510)   TDaP vaccine                       Flu Shot: HIV: Non Reactive (06/29 1510)   Baby Food                                GBS:   Contraception  Pap:  CBB     CS/VBAC    Support Person                  Preterm labor symptoms and general obstetric precautions including but not limited to vaginal bleeding, contractions, leaking of fluid and fetal movement were reviewed in detail with the patient. Please refer to After Visit Summary for other counseling recommendations.  Return in about 5 days (around 04/10/2017) for schedule u/s for AFI and routine prenatal with NST (schedule for lab work too).  Lab work next week as lab was closed today at time of appt.   Thomasene MohairStephen Bari Handshoe, MD  04/05/2017 5:05 PM

## 2017-04-10 ENCOUNTER — Encounter: Payer: Self-pay | Admitting: Advanced Practice Midwife

## 2017-04-10 ENCOUNTER — Ambulatory Visit (INDEPENDENT_AMBULATORY_CARE_PROVIDER_SITE_OTHER): Payer: Medicaid Other | Admitting: Advanced Practice Midwife

## 2017-04-10 ENCOUNTER — Ambulatory Visit (INDEPENDENT_AMBULATORY_CARE_PROVIDER_SITE_OTHER): Payer: Medicaid Other

## 2017-04-10 VITALS — BP 138/90 | Wt 250.0 lb

## 2017-04-10 DIAGNOSIS — Z3480 Encounter for supervision of other normal pregnancy, unspecified trimester: Secondary | ICD-10-CM

## 2017-04-10 DIAGNOSIS — Z3A34 34 weeks gestation of pregnancy: Secondary | ICD-10-CM

## 2017-04-10 DIAGNOSIS — Z362 Encounter for other antenatal screening follow-up: Secondary | ICD-10-CM | POA: Diagnosis not present

## 2017-04-10 DIAGNOSIS — O163 Unspecified maternal hypertension, third trimester: Secondary | ICD-10-CM | POA: Diagnosis not present

## 2017-04-10 DIAGNOSIS — O0990 Supervision of high risk pregnancy, unspecified, unspecified trimester: Secondary | ICD-10-CM

## 2017-04-10 LAB — FETAL NONSTRESS TEST

## 2017-04-10 LAB — STREP GP B NAA: Strep Gp B NAA: NEGATIVE

## 2017-04-10 NOTE — Progress Notes (Addendum)
Routine Prenatal Care Visit  Subjective  Anne Mendoza is a 36 y.o. [id number] at 335w6d being seen today for ongoing prenatal care.  She is currently monitored for the following issues for this high-risk pregnancy and has Supervision of high risk pregnancy, antepartum and Elevated blood pressure affecting pregnancy in third trimester, antepartum on their problem list.  ----------------------------------------------------------------------------------- Patient reports swelling in lower extremities. She has had a mild headache that has resolved. She denies visual changes or epigastric pain. Patient had discussed with Dr Jean RosenthalJackson about Induction at 37 weeks given her elevated blood pressure. She prefers to wait until 38 weeks to see if she will go into labor on her own. She says that all of her children have been born at 7937 or 38 weeks. If her symptoms do not get worse, her preference is to have a cervical sweep at 37 weeks and then induce at 38 weeks if not already delivered.    Contractions: Not present. Vag. Bleeding: None.  Movement: Present. Denies leaking of fluid.  ----------------------------------------------------------------------------------- The following portions of the patient's history were reviewed and updated as appropriate: allergies, current medications, past family history, past medical history, past social history, past surgical history and problem list. Problem list updated.   Objective  Blood pressure 138/90, weight 250 lb (113.4 kg), last menstrual period 08/09/2016 Pregravid weight 208 lb (94.3 kg) Total Weight Gain 42 lb (19.1 kg)   Recheck of blood pressure was 134/88  Urinalysis: Urine Protein: Negative Urine Glucose: Negative  Fetal Status: Fetal Heart Rate (bpm): 150   Movement: Present  Presentation: Vertex  Reactive NST today was 30 minutes. 150 bpm baseline, moderate variability, +accelerations, -decelerations.   General:  Alert, oriented and cooperative.  Patient is in no acute distress.  Skin: Skin is warm and dry. No rash noted.   Cardiovascular: Normal heart rate noted  Respiratory: Normal respiratory effort, no problems with respiration noted  Abdomen: Soft, gravid, appropriate for gestational age. Pain/Pressure: Absent     Pelvic:  Cervical exam deferred        Extremities: Normal range of motion.     Mental Status: Normal mood and affect. Normal behavior. Normal judgment and thought content.   Assessment   36 y.o. [id number] at [redacted]w[redacted]d by  05/16/2017, by Last Menstrual Period presenting for routine prenatal visit  Plan   pregnancy Problems (from 10/12/16 to present)    Problem Noted Resolved   Supervision of high risk pregnancy, antepartum 10/12/2016 by Conard NovakJackson, Stephen D, MD No   Overview Addendum 03/06/2017  8:52 AM by Vena AustriaStaebler, Andreas, MD    Clinic [facility] Prenatal Labs  Dating LMP = 10 wk Blood type: O/Positive/-- (06/29 1510)   Genetic Screen declined   msAFP:      Antibody:Negative (06/29 1510)  Anatomic US Female, incomplete for 4-chamber and PCI 12/21/16 Rubella: 1.64 (06/29 1510)  Varicella: Immune  GTT Third trimester: 129 RPR: Non Reactive (06/29 1510)   Rhogam  HBsAg: Negative (06/29 1510)   TDaP vaccine                       Flu Shot: HIV: Non Reactive (06/29 1510)   Baby Food                                GBS:   Contraception  Pap:  CBB     CS/VBAC NA   Support Person  Preterm labor symptoms and general obstetric precautions including but not limited to vaginal bleeding, contractions, leaking of fluid and fetal movement were reviewed in detail with the patient.   Return in about 1 week (around 04/17/2017) for AFI/NST/ROB in 1 wk and Growth/AFI/NST/ROB in 2 wks.  Tresea MallJane Meighan Treto, CNM  04/10/2017 3:56 PM

## 2017-04-10 NOTE — Progress Notes (Signed)
No vb. No lof. NST today °

## 2017-04-10 NOTE — Addendum Note (Signed)
Addended by: Ulyess MortMASSEY, Tylasia Fletchall P on: 04/10/2017 04:53 PM   Modules accepted: Orders

## 2017-04-11 LAB — COMPREHENSIVE METABOLIC PANEL
ALK PHOS: 127 IU/L — AB (ref 39–117)
ALT: 11 IU/L (ref 0–32)
AST: 19 IU/L (ref 0–40)
Albumin/Globulin Ratio: 1.3 (ref 1.2–2.2)
Albumin: 3.3 g/dL — ABNORMAL LOW (ref 3.5–5.5)
BUN/Creatinine Ratio: 9 (ref 9–23)
BUN: 6 mg/dL (ref 6–20)
Bilirubin Total: 0.2 mg/dL (ref 0.0–1.2)
CALCIUM: 9.2 mg/dL (ref 8.7–10.2)
CO2: 19 mmol/L — AB (ref 20–29)
CREATININE: 0.66 mg/dL (ref 0.57–1.00)
Chloride: 104 mmol/L (ref 96–106)
GFR calc Af Amer: 133 mL/min/{1.73_m2} (ref >59)
GFR, EST NON AFRICAN AMERICAN: 116 mL/min/{1.73_m2} (ref >59)
GLUCOSE: 109 mg/dL — AB (ref 65–99)
Globulin, Total: 2.5 g/dL (ref 1.5–4.5)
Potassium: 4 mmol/L (ref 3.5–5.2)
Sodium: 139 mmol/L (ref 134–144)
Total Protein: 5.8 g/dL — ABNORMAL LOW (ref 6.0–8.5)

## 2017-04-11 LAB — PROTEIN / CREATININE RATIO, URINE
CREATININE, UR: 148.5 mg/dL
PROTEIN UR: 25.5 mg/dL
PROTEIN/CREAT RATIO: 172 mg/g{creat} (ref 0–200)

## 2017-04-11 LAB — GC/CHLAMYDIA PROBE AMP
CHLAMYDIA, DNA PROBE: NEGATIVE
Neisseria gonorrhoeae by PCR: NEGATIVE

## 2017-04-11 LAB — CBC
HEMATOCRIT: 34.4 % (ref 34.0–46.6)
Hemoglobin: 11.6 g/dL (ref 11.1–15.9)
MCH: 29.4 pg (ref 26.6–33.0)
MCHC: 33.7 g/dL (ref 31.5–35.7)
MCV: 87 fL (ref 79–97)
PLATELETS: 286 10*3/uL (ref 150–379)
RBC: 3.94 x10E6/uL (ref 3.77–5.28)
RDW: 13.7 % (ref 12.3–15.4)
WBC: 12.7 10*3/uL — AB (ref 3.4–10.8)

## 2017-04-12 ENCOUNTER — Encounter: Payer: Self-pay | Admitting: Obstetrics and Gynecology

## 2017-04-16 NOTE — L&D Delivery Note (Signed)
Delivery Note Primary OB: [facility] Delivery Physician: Adelene Idlerhristanna Lindwood Mogel MD Gestational Age: Premature at 37 weeks weeks gestation Antepartum complications: gestational hypertension Intrapartum complications: None  A viable Female was delivered via LOA presentation. Anterior shoulder delivered with gentle downward guidance. Posterior shoulder delivered with upward guidance. Infant placed on maternal abdomen. Cord clamped and cut after 1 minute.  NICU in attendance. Placenta delivered spontaneously and intact 3 vessel cord. Small periurethral laceration, no repair needed. Fundus firm and below belly button. Mom and baby recovering in stable condition.   Apgars: 8, 9   Weight:  2710 grams Placenta status: spontaneous and Intact.  Cord: 3+ vessels;  with the following complications: marginal cord insertion  Anesthesia:  none Episiotomy:  none Lacerations:  periuretheral Suture Repair: none Est. Blood Loss (mL):  300 mL  Mom to postpartum.  Baby to Couplet care / Skin to Skin.  Adelene Idlerhristanna Pattiann Solanki , MD [facility], [facility] 04/30/2017  2:17 PM

## 2017-04-19 ENCOUNTER — Ambulatory Visit (INDEPENDENT_AMBULATORY_CARE_PROVIDER_SITE_OTHER): Payer: Medicaid Other

## 2017-04-19 ENCOUNTER — Ambulatory Visit (INDEPENDENT_AMBULATORY_CARE_PROVIDER_SITE_OTHER): Payer: Medicaid Other | Admitting: Maternal Newborn

## 2017-04-19 VITALS — BP 148/86 | Wt 254.0 lb

## 2017-04-19 DIAGNOSIS — O0990 Supervision of high risk pregnancy, unspecified, unspecified trimester: Secondary | ICD-10-CM

## 2017-04-19 DIAGNOSIS — Z362 Encounter for other antenatal screening follow-up: Secondary | ICD-10-CM

## 2017-04-19 DIAGNOSIS — O163 Unspecified maternal hypertension, third trimester: Secondary | ICD-10-CM

## 2017-04-19 DIAGNOSIS — Z3A36 36 weeks gestation of pregnancy: Secondary | ICD-10-CM

## 2017-04-19 LAB — FETAL NONSTRESS TEST

## 2017-04-19 NOTE — Progress Notes (Signed)
    Routine Prenatal Care Visit  Subjective  Anne Mendoza is a 37 y.o. [phone] at 3058w1d being seen today for ongoing prenatal care.  Anne Mendoza and has Supervision of high risk Mendoza, antepartum and Elevated blood pressure affecting Mendoza in third trimester, antepartum on their problem list.  ----------------------------------------------------------------------------------- Patient reports no complaints.   Contractions: Not present. Vag. Bleeding: None.  Movement: Present. Denies leaking of fluid.  ----------------------------------------------------------------------------------- The following portions of the patient's history were reviewed and updated as appropriate: allergies, current medications, past family history, past medical history, past social history, past surgical history and problem list. Problem list updated.   Objective  Last menstrual period 08/09/2016, unknown if currently breastfeeding. Pregravid weight 208 lb (94.3 kg) Total Weight Gain 46 lb (20.9 kg) Urinalysis: Urine Protein: Negative Urine Glucose: Negative  Fetal Status: Fetal Heart Rate (bpm): 160   Movement: Present  Presentation: Vertex  General:  Alert, oriented and cooperative. Patient is in no acute distress.  Skin: Skin is warm and dry. No rash noted.   Cardiovascular: Normal heart rate noted  Respiratory: Normal respiratory effort, no problems with respiration noted  Abdomen: Soft, gravid, appropriate for gestational age. Pain/Pressure: Absent     Pelvic:  Cervical exam performed Dilation: Closed Effacement (%): 20 Station: -3  Extremities: Normal range of motion.     Mental Status: Normal mood and affect. Normal behavior. Normal judgment and thought content.   Baseline: 160 Variability: moderate Accelerations: present Decelerations: absent Tocometry: none The patient was monitored for 30 minutes, fetal heart rate  tracing was deemed reactive, category I tracing.  Assessment   37 y.o. [id number] at 6858w1d, EDD 05/16/2017 by Last Menstrual Period presenting for routine prenatal visit.   Plan   Mendoza Problems (from 10/12/16 to present)    Problem Noted Resolved   Supervision of high risk Mendoza, antepartum 10/12/2016 by Conard NovakJackson, Stephen D, MD No   Overview Addendum 04/12/2017  1:27 PM by Conard NovakJackson, Stephen D, MD    Clinic [facility] Prenatal Labs  Dating LMP = 10 wk Blood type: O/Positive/-- (06/29 1510)   Genetic Screen declined   msAFP:      Antibody:Negative (06/29 1510)  Anatomic US Female, incomplete for 4-chamber and PCI 12/21/16 Rubella: 1.64 (06/29 1510)  Varicella: Immune  GTT Third trimester: 129 RPR: Non Reactive (11/20 0923)   Rhogam  HBsAg: Negative (06/29 1510)   TDaP vaccine                       Flu Shot: HIV: Non Reactive (11/20 0923)   Baby Food                                XBJ:[id number]:Negative (12/21 1710)  Contraception  Pap:  CBB     CS/VBAC    Support Person                   Preterm labor symptoms and general obstetric precautions including but not limited to vaginal bleeding, contractions, leaking of fluid and fetal movement were reviewed in detail with the patient.  Induction of labor scheduled for 04/30/2017 at 8 am and notified patient of time. H&P next visit.  Keep scheduled appointment on 04/25/2017 for ROB and growth/AFI/NST.  Marcelyn BruinsJacelyn Neela Zecca, CNM 04/19/2017  3:00 PM

## 2017-04-19 NOTE — Progress Notes (Signed)
C/o wants cx [email or web address]

## 2017-04-20 LAB — COMPREHENSIVE METABOLIC PANEL
ALT: 12 IU/L (ref 0–32)
AST: 18 IU/L (ref 0–40)
Albumin/Globulin Ratio: 1.3 (ref 1.2–2.2)
Albumin: 3.2 g/dL — ABNORMAL LOW (ref 3.5–5.5)
Alkaline Phosphatase: 123 IU/L — ABNORMAL HIGH (ref 39–117)
BUN/Creatinine Ratio: 6 — ABNORMAL LOW (ref 9–23)
BUN: 4 mg/dL — ABNORMAL LOW (ref 6–20)
Bilirubin Total: <0.2 mg/dL (ref 0.0–1.2)
CALCIUM: 8.8 mg/dL (ref 8.7–10.2)
CO2: 18 mmol/L — ABNORMAL LOW (ref 20–29)
Chloride: 106 mmol/L (ref 96–106)
Creatinine, Ser: 0.65 mg/dL (ref 0.57–1.00)
GFR, EST AFRICAN AMERICAN: 134 mL/min/{1.73_m2} (ref >59)
GFR, EST NON AFRICAN AMERICAN: 116 mL/min/{1.73_m2} (ref >59)
Globulin, Total: 2.4 g/dL (ref 1.5–4.5)
Glucose: 98 mg/dL (ref 65–99)
POTASSIUM: 3.9 mmol/L (ref 3.5–5.2)
Sodium: 140 mmol/L (ref 134–144)
Total Protein: 5.6 g/dL — ABNORMAL LOW (ref 6.0–8.5)

## 2017-04-20 LAB — CBC
Hematocrit: 34.1 % (ref 34.0–46.6)
Hemoglobin: 11.1 g/dL (ref 11.1–15.9)
MCH: 29.8 pg (ref 26.6–33.0)
MCHC: 32.6 g/dL (ref 31.5–35.7)
MCV: 91 fL (ref 79–97)
Platelets: 263 10*3/uL (ref 150–379)
RBC: 3.73 x10E6/uL — ABNORMAL LOW (ref 3.77–5.28)
RDW: 13.7 % (ref 12.3–15.4)
WBC: 12.5 10*3/uL — AB (ref 3.4–10.8)

## 2017-04-20 LAB — PROTEIN / CREATININE RATIO, URINE
Creatinine, Urine: 116.3 mg/dL
PROTEIN UR: 36.7 mg/dL
Protein/Creat Ratio: 316 mg/g creat — ABNORMAL HIGH (ref 0–200)

## 2017-04-22 NOTE — Progress Notes (Signed)
Per Dr. Jean RosenthalJackson, OK to leave induction as scheduled on 04/30/17 in light of clinical picture.

## 2017-04-25 ENCOUNTER — Encounter: Payer: Self-pay | Admitting: Advanced Practice Midwife

## 2017-04-25 ENCOUNTER — Ambulatory Visit (INDEPENDENT_AMBULATORY_CARE_PROVIDER_SITE_OTHER): Payer: Medicaid Other

## 2017-04-25 ENCOUNTER — Ambulatory Visit (INDEPENDENT_AMBULATORY_CARE_PROVIDER_SITE_OTHER): Payer: Medicaid Other | Admitting: Advanced Practice Midwife

## 2017-04-25 VITALS — BP 138/82 | Wt 255.0 lb

## 2017-04-25 DIAGNOSIS — O0990 Supervision of high risk pregnancy, unspecified, unspecified trimester: Secondary | ICD-10-CM | POA: Diagnosis not present

## 2017-04-25 DIAGNOSIS — Z3A37 37 weeks gestation of pregnancy: Secondary | ICD-10-CM

## 2017-04-25 DIAGNOSIS — Z362 Encounter for other antenatal screening follow-up: Secondary | ICD-10-CM | POA: Diagnosis not present

## 2017-04-25 LAB — FETAL NONSTRESS TEST

## 2017-04-25 NOTE — Progress Notes (Signed)
  Routine Prenatal Care Visit  Subjective  Anne Mendoza is a 37 y.o. [phone] at 7017w0d being seen today for ongoing prenatal care.  She is currently monitored for the following issues for this high-risk pregnancy and has Supervision of high risk pregnancy, antepartum and Elevated blood pressure affecting pregnancy in third trimester, antepartum on their problem list.  ----------------------------------------------------------------------------------- Patient reports no complaints.   Contractions: Irregular.  .  Movement: Present. Denies leaking of fluid.  ----------------------------------------------------------------------------------- The following portions of the patient's history were reviewed and updated as appropriate: allergies, current medications, past family history, past medical history, past social history, past surgical history and problem list. Problem list updated.   Objective  Blood pressure 138/82, weight 255 lb (115.7 kg), last menstrual period 08/09/2016 Pregravid weight 208 lb (94.3 kg) Total Weight Gain 47 lb (21.3 kg) Urinalysis: Urine Protein: Negative Urine Glucose: Negative  Fetal Status: Fetal Heart Rate (bpm): 140   Movement: Present  Presentation: Vertex  Growth scan today: 6 pounds 10 ounces, 46%, AFI: 12.55 NST reactive today on 20 minute strip: baseline 140 bpm, moderate variability, + accelerations, - decelerations  General:  Alert, oriented and cooperative. Patient is in no acute distress.  Skin: Skin is warm and dry. No rash noted.   Cardiovascular: Normal heart rate noted  Respiratory: Normal respiratory effort, no problems with respiration noted  Abdomen: Soft, gravid, appropriate for gestational age. Pain/Pressure: Present     Pelvic:  Cervical exam performed Dilation: 3 Effacement (%): 70 Station: -1 Cervical sweep today  Extremities: Normal range of motion.  Edema: None  Mental Status: Normal mood and affect. Normal behavior. Normal judgment  and thought content.   Assessment   37 y.o. [id number] at 4317w0d by  05/16/2017, by Last Menstrual Period presenting for routine prenatal visit  Plan   pregnancy Problems (from 10/12/16 to present)    Problem Noted Resolved   Supervision of high risk pregnancy, antepartum 10/12/2016 by Conard NovakJackson, Stephen D, MD No   Overview Addendum 04/12/2017  1:27 PM by Conard NovakJackson, Stephen D, MD    Clinic [facility] Prenatal Labs  Dating LMP = 10 wk Blood type: O/Positive/-- (06/29 1510)   Genetic Screen declined   msAFP:      Antibody:Negative (06/29 1510)  Anatomic US Female, incomplete for 4-chamber and PCI 12/21/16 Rubella: 1.64 (06/29 1510)  Varicella: Immune  GTT Third trimester: 129 RPR: Non Reactive (11/20 0923)   Rhogam  HBsAg: Negative (06/29 1510)   TDaP vaccine                       Flu Shot: HIV: Non Reactive (11/20 0923)   Baby Food                                XBJ:[id number]:Negative (12/21 1710)  Contraception  Pap:  CBB     CS/VBAC NA   Support Person Husband Apolinar JunesBrandon                  Term labor symptoms and general obstetric precautions including but not limited to vaginal bleeding, contractions, leaking of fluid and fetal movement were reviewed in detail with the patient.   Return for has induction scheduled for next tuesday.  Tresea MallJane Jayjay Littles, CNM  04/25/2017 4:31 PM

## 2017-04-25 NOTE — Progress Notes (Signed)
Pt reports no problems. AFI/NST today.

## 2017-04-25 NOTE — H&P (Signed)
OB History & Physical   Note written at office visit on 04/25/2017  History of Present Illness:  Chief Complaint: induction of labor for elevated blood pressure in third trimester  HPI:  Anne Mendoza is a 37 y.o. [id number] female at [redacted]w[redacted]d dated by LMP.  Her pregnancy has been complicated by elevated blood pressure in third trimester.    She reports occasional contractions.   She denies leakage of fluid.   She denies vaginal bleeding.   She reports fetal movement.    Maternal Medical History:   Past Medical History:  Diagnosis Date  . GERD (gastroesophageal reflux disease)   . Pre-eclampsia     Past Surgical History:  Procedure Laterality Date  . CHOLECYSTECTOMY      No Known Allergies  Prior to Admission medications   Medication Sig Start Date End Date Taking? Authorizing Provider  Prenatal Vit-Fe Phos-FA-Omega (VITAFOL GUMMIES) 3.33-0.333-34.8 MG CHEW Chew 3 tablets by mouth daily. 10/24/16  Yes Tresea Mall, CNM  zolpidem (AMBIEN) 5 MG tablet Take 1 tablet (5 mg total) by mouth at bedtime as needed for sleep. Patient not taking: Reported on 04/25/2017 03/22/17   Vena Austria, MD    OB History  Gravida Para Term Preterm AB Living  6 5 4  0 0 4  SAB TAB Ectopic Multiple Live Births  0 0 0 0 4    # Outcome Date GA Lbr Len/2nd Weight Sex Delivery Anes PTL Lv  6 Current           5 Para 08/19/14  01:59 / 00:05 7 lb 0.5 oz (3.189 kg) M Vag-Spont None  LIV     Birth Comments: none  4 Term           3 Term           2 Term           1 Term               Prenatal care site: [facility]  Social History: She  reports that  has never smoked. she has never used smokeless tobacco. She reports that she does not drink alcohol or use drugs.  Family History: family history includes Brain cancer in her cousin; Breast cancer in her cousin; Lung cancer in her maternal uncle.    Review of Systems: Negative x 10 systems reviewed except as noted in the HPI.    Physical  Exam:  Vital Signs: BP 138/82   Wt 255 lb (115.7 kg)   LMP 08/09/2016   BMI 36.59 kg/m  Constitutional: Well nourished, well developed female in no acute distress.  HEENT: normal Skin: Warm and dry.  Cardiovascular: Regular rate and rhythm.   Extremity: trace edema  Respiratory: Clear to auscultation bilateral. Normal respiratory effort Abdomen: FHT present Back: no CVAT Neuro: DTRs 2+, Cranial nerves grossly intact Psych: Alert and Oriented x3. No memory deficits. Normal mood and affect.  MS: normal gait, normal bilateral lower extremity ROM/strength/stability.  Pelvic exam:  is not limited by body habitus EGBUS: within normal limits Vagina: within normal limits and with normal mucosa  Cervix: 3/70-80/-1   Pertinent Results:  Prenatal Labs: Blood type/Rh O positive  Antibody screen negative  Rubella Immune  Varicella Immune    RPR Non-reactive  HBsAg negative  HIV negative  GC negative  Chlamydia negative  Genetic screening Not done  1 hour GTT 127 on 11/20  3 hour GTT NA  GBS negative on 12/21   Baseline FHR:  140 beats/min   Variability: moderate   Accelerations: present   Decelerations: absent Contractions: not assessed in office Overall assessment: Category I   Assessment:  Anne Mendoza is a 37 y.o. [id number] female at 1637w0d with induction of labor for elevated blood pressure.   Plan:  1. Admit to Labor & Delivery  2. CBC, T&S, Clrs, IVF 3. GBS negative.   4. Fetal well-being: Category I 5. Consider AROM to augment   Tresea MallJane Stephens Shreve, CNM 04/25/2017 4:42 PM

## 2017-04-30 ENCOUNTER — Inpatient Hospital Stay
Admission: EM | Admit: 2017-04-30 | Discharge: 2017-05-02 | DRG: 806 | Disposition: A | Discharge status: Home / Self Care | Payer: Medicaid Other | Attending: Obstetrics and Gynecology | Admitting: Obstetrics and Gynecology

## 2017-04-30 ENCOUNTER — Other Ambulatory Visit: Payer: Self-pay

## 2017-04-30 ENCOUNTER — Telehealth: Payer: Self-pay

## 2017-04-30 DIAGNOSIS — O43123 Velamentous insertion of umbilical cord, third trimester: Secondary | ICD-10-CM | POA: Diagnosis present

## 2017-04-30 DIAGNOSIS — O134 Gestational [pregnancy-induced] hypertension without significant proteinuria, complicating childbirth: Principal | ICD-10-CM | POA: Diagnosis present

## 2017-04-30 DIAGNOSIS — O9081 Anemia of the puerperium: Secondary | ICD-10-CM | POA: Diagnosis not present

## 2017-04-30 DIAGNOSIS — Z3A37 37 weeks gestation of pregnancy: Secondary | ICD-10-CM

## 2017-04-30 DIAGNOSIS — O0990 Supervision of high risk pregnancy, unspecified, unspecified trimester: Secondary | ICD-10-CM

## 2017-04-30 DIAGNOSIS — D62 Acute posthemorrhagic anemia: Secondary | ICD-10-CM | POA: Diagnosis not present

## 2017-04-30 DIAGNOSIS — O139 Gestational [pregnancy-induced] hypertension without significant proteinuria, unspecified trimester: Secondary | ICD-10-CM | POA: Diagnosis present

## 2017-04-30 DIAGNOSIS — R030 Elevated blood-pressure reading, without diagnosis of hypertension: Secondary | ICD-10-CM | POA: Diagnosis present

## 2017-04-30 HISTORY — DX: Gestational (pregnancy-induced) hypertension without significant proteinuria, unspecified trimester: O13.9

## 2017-04-30 LAB — COMPREHENSIVE METABOLIC PANEL
ALBUMIN: 2.9 g/dL — AB (ref 3.5–5.0)
ALT: 15 U/L (ref 14–54)
ANION GAP: 11 (ref 5–15)
AST: 28 U/L (ref 15–41)
Alkaline Phosphatase: 142 U/L — ABNORMAL HIGH (ref 38–126)
BILIRUBIN TOTAL: 0.5 mg/dL (ref 0.3–1.2)
BUN: 5 mg/dL — ABNORMAL LOW (ref 6–20)
CHLORIDE: 106 mmol/L (ref 101–111)
CO2: 17 mmol/L — ABNORMAL LOW (ref 22–32)
Calcium: 9.2 mg/dL (ref 8.9–10.3)
Creatinine, Ser: 0.68 mg/dL (ref 0.44–1.00)
GFR calc Af Amer: >60 mL/min (ref >60)
GLUCOSE: 131 mg/dL — AB (ref 65–99)
Potassium: 3.6 mmol/L (ref 3.5–5.1)
Sodium: 134 mmol/L — ABNORMAL LOW (ref 135–145)
TOTAL PROTEIN: 6.8 g/dL (ref 6.5–8.1)

## 2017-04-30 LAB — CBC
HEMATOCRIT: 34.9 % — AB (ref 35.0–47.0)
HEMOGLOBIN: 11.7 g/dL — AB (ref 12.0–16.0)
MCH: 29.9 pg (ref 26.0–34.0)
MCHC: 33.6 g/dL (ref 32.0–36.0)
MCV: 89 fL (ref 80.0–100.0)
Platelets: 273 10*3/uL (ref 150–440)
RBC: 3.93 MIL/uL (ref 3.80–5.20)
RDW: 13.5 % (ref 11.5–14.5)
WBC: 12.9 10*3/uL — AB (ref 3.6–11.0)

## 2017-04-30 LAB — PROTEIN / CREATININE RATIO, URINE
CREATININE, URINE: 64 mg/dL
PROTEIN CREATININE RATIO: 0.8 mg/mg{creat} — AB (ref 0.00–0.15)
Total Protein, Urine: 51 mg/dL

## 2017-04-30 LAB — TYPE AND SCREEN
ABO/RH(D): O POS
ANTIBODY SCREEN: NEGATIVE

## 2017-04-30 MED ORDER — DOCUSATE SODIUM 100 MG PO CAPS
100.0000 mg | ORAL_CAPSULE | Freq: Two times a day (BID) | ORAL | Status: DC
  Administered 2017-04-30 – 2017-05-02 (×4): 100 mg via ORAL
  Filled 2017-04-30 (×4): qty 1

## 2017-04-30 MED ORDER — ZOLPIDEM TARTRATE 5 MG PO TABS: 5.0000 mg | ORAL_TABLET | Freq: Every evening | ORAL | Status: DC | PRN

## 2017-04-30 MED ORDER — SIMETHICONE 80 MG PO CHEW
80.0000 mg | CHEWABLE_TABLET | ORAL | Status: DC | PRN
  Administered 2017-04-30: 80 mg via ORAL
  Filled 2017-04-30 (×2): qty 1

## 2017-04-30 MED ORDER — OXYTOCIN 10 UNIT/ML IJ SOLN
INTRAMUSCULAR | Status: AC
  Filled 2017-04-30: qty 2

## 2017-04-30 MED ORDER — OXYTOCIN 40 UNITS IN LACTATED RINGERS INFUSION - SIMPLE MED
1.0000 m[IU]/min | INTRAVENOUS | Status: DC
  Administered 2017-04-30: 2 m[IU]/min via INTRAVENOUS
  Filled 2017-04-30: qty 1000

## 2017-04-30 MED ORDER — LIDOCAINE HCL (PF) 1 % IJ SOLN
INTRAMUSCULAR | Status: AC
  Filled 2017-04-30: qty 30

## 2017-04-30 MED ORDER — LACTATED RINGERS IV SOLN: 500.0000 mL | INTRAVENOUS | Status: DC | PRN

## 2017-04-30 MED ORDER — MISOPROSTOL 200 MCG PO TABS
ORAL_TABLET | ORAL | Status: AC
  Filled 2017-04-30: qty 4

## 2017-04-30 MED ORDER — AMMONIA AROMATIC IN INHA
RESPIRATORY_TRACT | Status: AC
  Filled 2017-04-30: qty 10

## 2017-04-30 MED ORDER — ACETAMINOPHEN 325 MG PO TABS
650.0000 mg | ORAL_TABLET | ORAL | Status: DC | PRN
  Filled 2017-04-30: qty 2

## 2017-04-30 MED ORDER — ONDANSETRON HCL 4 MG/2ML IJ SOLN: 4.0000 mg | INTRAMUSCULAR | Status: DC | PRN

## 2017-04-30 MED ORDER — MISOPROSTOL 25 MCG QUARTER TABLET: 25.0000 ug | ORAL_TABLET | ORAL | Status: DC | PRN

## 2017-04-30 MED ORDER — VITAFOL GUMMIES 3.33-0.333-34.8 MG PO CHEW: 3.0000 | CHEWABLE_TABLET | Freq: Every day | ORAL | Status: DC

## 2017-04-30 MED ORDER — BENZOCAINE-MENTHOL 20-0.5 % EX AERO: 1.0000 "application " | INHALATION_SPRAY | CUTANEOUS | Status: DC | PRN

## 2017-04-30 MED ORDER — ACETAMINOPHEN 325 MG PO TABS
650.0000 mg | ORAL_TABLET | ORAL | Status: DC | PRN
  Administered 2017-04-30 – 2017-05-01 (×3): 650 mg via ORAL
  Filled 2017-04-30 (×2): qty 2

## 2017-04-30 MED ORDER — MEASLES, MUMPS & RUBELLA VAC ~~LOC~~ INJ
0.5000 mL | INJECTION | Freq: Once | SUBCUTANEOUS | Status: DC
  Filled 2017-04-30: qty 0.5

## 2017-04-30 MED ORDER — DIBUCAINE 1 % RE OINT: 1.0000 "application " | TOPICAL_OINTMENT | RECTAL | Status: DC | PRN

## 2017-04-30 MED ORDER — OXYTOCIN 40 UNITS IN LACTATED RINGERS INFUSION - SIMPLE MED: 2.5000 [IU]/h | INTRAVENOUS | Status: DC

## 2017-04-30 MED ORDER — DIPHENHYDRAMINE HCL 25 MG PO CAPS: 25.0000 mg | ORAL_CAPSULE | Freq: Four times a day (QID) | ORAL | Status: DC | PRN

## 2017-04-30 MED ORDER — COCONUT OIL OIL: 1.0000 "application " | TOPICAL_OIL | Status: DC | PRN

## 2017-04-30 MED ORDER — HYDRALAZINE HCL 20 MG/ML IJ SOLN: 10.0000 mg | Freq: Once | INTRAMUSCULAR | Status: DC | PRN

## 2017-04-30 MED ORDER — IBUPROFEN 600 MG PO TABS
600.0000 mg | ORAL_TABLET | Freq: Four times a day (QID) | ORAL | Status: DC
  Administered 2017-04-30 – 2017-05-02 (×7): 600 mg via ORAL
  Filled 2017-04-30 (×7): qty 1

## 2017-04-30 MED ORDER — WITCH HAZEL-GLYCERIN EX PADS: 1.0000 "application " | MEDICATED_PAD | CUTANEOUS | Status: DC | PRN

## 2017-04-30 MED ORDER — PRENATAL MULTIVITAMIN CH
1.0000 | ORAL_TABLET | Freq: Every day | ORAL | Status: DC
  Administered 2017-05-01: 1 via ORAL
  Filled 2017-04-30: qty 1

## 2017-04-30 MED ORDER — ONDANSETRON HCL 4 MG/2ML IJ SOLN: 4.0000 mg | Freq: Four times a day (QID) | INTRAMUSCULAR | Status: DC | PRN

## 2017-04-30 MED ORDER — ONDANSETRON HCL 4 MG PO TABS: 4.0000 mg | ORAL_TABLET | ORAL | Status: DC | PRN

## 2017-04-30 MED ORDER — MAGNESIUM HYDROXIDE 400 MG/5ML PO SUSP: 30.0000 mL | ORAL | Status: DC | PRN

## 2017-04-30 MED ORDER — LACTATED RINGERS IV SOLN
INTRAVENOUS | Status: DC
  Administered 2017-04-30: 09:00:00 via INTRAVENOUS

## 2017-04-30 MED ORDER — LABETALOL HCL 5 MG/ML IV SOLN: 20.0000 mg | INTRAVENOUS | Status: DC | PRN

## 2017-04-30 MED ORDER — OXYTOCIN BOLUS FROM INFUSION: 500.0000 mL | Freq: Once | INTRAVENOUS | Status: DC

## 2017-04-30 MED ORDER — TERBUTALINE SULFATE 1 MG/ML IJ SOLN: 0.2500 mg | Freq: Once | INTRAMUSCULAR | Status: DC | PRN

## 2017-04-30 MED ORDER — TETANUS-DIPHTH-ACELL PERTUSSIS 5-2.5-18.5 LF-MCG/0.5 IM SUSP: 0.5000 mL | Freq: Once | INTRAMUSCULAR | Status: DC

## 2017-04-30 NOTE — Lactation Note (Addendum)
This note was copied from a baby's chart. Lactation Consultation Note  Patient Name: Anne Mendoza LKGMW'NToday's Date: 04/30/2017 Reason for consult: Initial assessment;Difficult latch(inverted/flat nipples)   This P5 Mom tried to nurse her other 4 children, but could not because of very flat and inverted nipples, so she pumped and bottle fed them. She says she has had to give a little formula in the early days until she can get enough colostrum/milk out. I did try to her her roll nipples, pre-pump, but we were not able to get them out enough to even get a nipple shield on. I had her try hand expression and massage a while and then hands on pumping with electric pump and then try manual pump to see if one was better than the other right now in everting nipple. Once she gets bra on, I have breast shells for her to wear between feeds to see if that helps with eversion. If no colostrum in the next few tried with expression/pumping, she plans to offer some formula until she can get her own milk out or a proper latch/breastfeed. 30mm flanges used for pumping (27 may be best, but we don't have any and 24 too small)   Maternal Data Formula Feeding for Exclusion: No Has patient been taught Hand Expression?: Yes Does the patient have breastfeeding experience prior to this delivery?: Yes  Feeding    LATCH Score Latch: Too sleepy or reluctant, no latch achieved, no sucking elicited.                 Interventions Interventions: Breast feeding basics reviewed;Assisted with latch;Skin to skin;Breast massage;Hand express;Pre-pump if needed;Support pillows;Position options;Expressed milk;DEBP;Shells(will get shells on once bra is on)  Lactation Tools Discussed/Used Pump Review: Setup, frequency, and cleaning;Milk Storage Initiated by:: Rosie FateSandra Elease Swarm Date initiated:: 04/30/17   Consult Status Consult Status: Follow-up Date: 05/01/17    Sunday CornSandra Clark Briann Sarchet 04/30/2017, 3:28 PM

## 2017-04-30 NOTE — Progress Notes (Signed)
  Labor Progress Note   37 y.o. [id number] @ 9021w5d , admitted for  Pregnancy, Labor Management. IOL for gestational hypertension  Subjective:  Out of bed standing/sitting on birth ball. Is beginning to feel stronger contractions.  Objective:  BP 137/76   Pulse 99   Temp 98.5 F (36.9 C) (Oral)   Resp 17   Ht 5\' 10"  (1.778 m)   Wt 254 lb (115.2 kg)   LMP 08/09/2016   BMI 36.45 kg/m  Abd: mild Extr: trace to 1+ bilateral pedal edema SVE: CERVIX: 4.5 cm dilated, 80 effaced, -1 station AROM: clear fluid  EFM: FHR: 145 bpm, variability: moderate,  accelerations:  Present,  decelerations:  Absent Toco: Frequency: Every 2-4 minutes  Labs: I have reviewed the patient's lab results.   Assessment & Plan:  [id number] @ 221w5d, admitted for  Pregnancy and Labor/Delivery Management  1. Pain management: none. 2. FWB: FHT category I.  3. ID: GBS negative 4. Labor management: s/p AROM clear fluid, continue pitocin for adequate labor pattern 5. PIH labs 6. Antihypertensive protocol as needed  All discussed with patient, see orders   Tresea MallJane Yoshiharu Brassell, CNM [facility], [facility] 04/30/2017  11:58 AM

## 2017-04-30 NOTE — H&P (Signed)
OB History & Physical   Updated H&P  History of Present Illness:  Chief Complaint: induction of labor for elevated blood pressure in third trimester  HPI:  Anne Mendoza is a 37 y.o. [id number] female at 9029w0d dated by LMP.  Her pregnancy has been complicated by elevated blood pressure in third trimester.    She reports occasional contractions.   She denies leakage of fluid.   She denies vaginal bleeding.   She reports fetal movement.  She denies headache, visual changes, epigastric pain, chest pain, shortness of breath, n/v/d.  Maternal Medical History:   Past Medical History:  Diagnosis Date  . Pre-eclampsia     Past Surgical History:  Procedure Laterality Date  . CHOLECYSTECTOMY      Allergies  Allergen Reactions  . Nickel Rash    Prior to Admission medications   Medication Sig Start Date End Date Taking? Authorizing Provider  Prenatal Vit-Fe Phos-FA-Omega (VITAFOL GUMMIES) 3.33-0.333-34.8 MG CHEW Chew 3 tablets by mouth daily. 10/24/16  Yes Tresea MallGledhill, Syriana Croslin, CNM  zolpidem (AMBIEN) 5 MG tablet Take 1 tablet (5 mg total) by mouth at bedtime as needed for sleep. Patient not taking: Reported on 04/25/2017 03/22/17   Vena AustriaStaebler, Andreas, MD    OB History  Gravida Para Term Preterm AB Living  6 5 4  0 0 4  SAB TAB Ectopic Multiple Live Births  0 0 0 0 4    # Outcome Date GA Lbr Len/2nd Weight Sex Delivery Anes PTL Lv  6 Current           5 Para 08/19/14  01:59 / 00:05 7 lb 0.5 oz (3.189 kg) M Vag-Spont None  LIV     Birth Comments: none  4 Term           3 Term           2 Term           1 Term               Prenatal care site: [facility]  Social History: She  reports that  has never smoked. she has never used smokeless tobacco. She reports that she does not drink alcohol or use drugs.  Family History: family history includes Brain cancer in her cousin; Breast cancer in her cousin; Lung cancer in her maternal uncle.    Review of Systems: Negative x 10 systems  reviewed except as noted in the HPI.    Physical Exam:  Vital Signs: BP (!) 143/89   Pulse (!) 103   LMP 08/09/2016  Constitutional: Well nourished, well developed female in no acute distress.  HEENT: normal Skin: Warm and dry.  Cardiovascular: Regular rate and rhythm.   Extremity: trace edema  Respiratory: Clear to auscultation bilateral. Normal respiratory effort Abdomen: FHT present Back: no CVAT Neuro: DTRs 2+, Cranial nerves grossly intact Psych: Alert and Oriented x3. No memory deficits. Normal mood and affect.  MS: normal gait, normal bilateral lower extremity ROM/strength/stability.  Pelvic exam:  is not limited by body habitus EGBUS: within normal limits Vagina: within normal limits and with normal mucosa  Cervix: 3.5/80/-1   Pertinent Results:  Prenatal Labs: Blood type/Rh O positive  Antibody screen negative  Rubella Immune  Varicella Immune    RPR Non-reactive  HBsAg negative  HIV negative  GC negative  Chlamydia negative  Genetic screening Not done  1 hour GTT 127 on 11/20  3 hour GTT NA  GBS negative on 12/21   Baseline  FHR: 140 beats/min   Variability: moderate   Accelerations: present   Decelerations: absent Contractions: not assessed in office Overall assessment: Category I   Assessment:  Anne Mendoza is a 37 y.o. [id number] female at [redacted]w[redacted]d with induction of labor for gestational hypertension.   Plan:  1. Admit to Labor & Delivery  2. CBC, T&S, Clrs, IVF 3. GBS negative.   4. Fetal well-being: Category I 5. Begin induction with pitocin   Tresea Mall, CNM 04/30/2017 8:58 AM

## 2017-05-01 LAB — RPR: RPR Ser Ql: NONREACTIVE

## 2017-05-01 LAB — CBC
HEMATOCRIT: 33.6 % — AB (ref 35.0–47.0)
HEMOGLOBIN: 11.3 g/dL — AB (ref 12.0–16.0)
MCH: 29.8 pg (ref 26.0–34.0)
MCHC: 33.5 g/dL (ref 32.0–36.0)
MCV: 89.2 fL (ref 80.0–100.0)
Platelets: 223 10*3/uL (ref 150–440)
RBC: 3.77 MIL/uL — ABNORMAL LOW (ref 3.80–5.20)
RDW: 13.4 % (ref 11.5–14.5)
WBC: 13.1 10*3/uL — ABNORMAL HIGH (ref 3.6–11.0)

## 2017-05-01 NOTE — Progress Notes (Signed)
PPD#1 SVD, Gestational HTN Subjective:  Sitting up in bed, well-appearing. Pain control is adequate with PRN medication. Voiding without difficulty. Tolerating a regular diet. Ambulating well.  Objective:   Blood pressure 139/76, pulse 89, temperature 98.4 F (36.9 C), temperature source Oral, resp. rate 20, height '5\' 10"'  (1.778 m), weight 254 lb (115.2 kg), last menstrual period 08/09/2016, SpO2 100 %, currently breastfeeding.  General: NAD Pulmonary: no increased work of breathing Abdomen: non-distended, non-tender Uterus:  fundus firm at U/1; lochia rubra small Laceration: well-approximated Extremities: no edema, no erythema, no tenderness, no signs of DVT  Results for orders placed or performed during the hospital encounter of 04/30/17 (from the past 72 hour(s))  CBC     Status: Abnormal   Collection Time: 04/30/17  9:11 AM  Result Value Ref Range   WBC 12.9 (H) 3.6 - 11.0 K/uL   RBC 3.93 3.80 - 5.20 MIL/uL   Hemoglobin 11.7 (L) 12.0 - 16.0 g/dL   HCT 34.9 (L) 35.0 - 47.0 %   MCV 89.0 80.0 - 100.0 fL   MCH 29.9 26.0 - 34.0 pg   MCHC 33.6 32.0 - 36.0 g/dL   RDW 13.5 11.5 - 14.5 %   Platelets 273 150 - 440 K/uL    Comment: Performed at [facility], [address] [place], [place] [address]  RPR     Status: None   Collection Time: 04/30/17  9:11 AM  Result Value Ref Range   RPR Ser Ql Non Reactive Non Reactive    Comment: (NOTE) Performed At: [facility] [place], [place] [id number] Rush Farmer MD [phone] Performed at [facility], [address] [place], [place] [address]   Type and screen     Status: None   Collection Time: 04/30/17  9:11 AM  Result Value Ref Range   ABO/RH(D) O POS    Antibody Screen NEG    Sample Expiration      05/03/2017 Performed at [facility], [address] [place], [place] [address]   Comprehensive metabolic panel     Status: Abnormal   Collection Time: 04/30/17 12:54 PM   Result Value Ref Range   Sodium 134 (L) 135 - 145 mmol/L   Potassium 3.6 3.5 - 5.1 mmol/L   Chloride 106 101 - 111 mmol/L   CO2 17 (L) 22 - 32 mmol/L   Glucose, Bld 131 (H) 65 - 99 mg/dL   BUN 5 (L) 6 - 20 mg/dL   Creatinine, Ser 0.68 0.44 - 1.00 mg/dL   Calcium 9.2 8.9 - 10.3 mg/dL   Total Protein 6.8 6.5 - 8.1 g/dL   Albumin 2.9 (L) 3.5 - 5.0 g/dL   AST 28 15 - 41 U/L   ALT 15 14 - 54 U/L   Alkaline Phosphatase 142 (H) 38 - 126 U/L   Total Bilirubin 0.5 0.3 - 1.2 mg/dL   GFR calc non Af Amer >60 >60 mL/min   GFR calc Af Amer >60 >60 mL/min    Comment: (NOTE) The eGFR has been calculated using the CKD EPI equation. This calculation has not been validated in all clinical situations. eGFR's persistently <60 mL/min signify possible Chronic Kidney Disease.    Anion gap 11 5 - 15    Comment: Performed at [facility], [address] [place], [place] [address]  Protein / creatinine ratio, urine     Status: Abnormal   Collection Time: 04/30/17  1:14 PM  Result Value Ref Range  Creatinine, Urine 64 mg/dL   Total Protein, Urine 51 mg/dL    Comment: NO NORMAL RANGE ESTABLISHED FOR THIS TEST   Protein Creatinine Ratio 0.80 (H) 0.00 - 0.15 mg/mg[Cre]    Comment: Performed at [facility], [address] [place], [place] [address]  CBC     Status: Abnormal   Collection Time: 05/01/17  5:14 AM  Result Value Ref Range   WBC 13.1 (H) 3.6 - 11.0 K/uL   RBC 3.77 (L) 3.80 - 5.20 MIL/uL   Hemoglobin 11.3 (L) 12.0 - 16.0 g/dL   HCT 33.6 (L) 35.0 - 47.0 %   MCV 89.2 80.0 - 100.0 fL   MCH 29.8 26.0 - 34.0 pg   MCHC 33.5 32.0 - 36.0 g/dL   RDW 13.4 11.5 - 14.5 %   Platelets 223 150 - 440 K/uL    Comment: Performed at [facility], [address], [place], [place] [address]    Assessment:   37 y.o. [id number] postpartum day # 1 in good condition.  Plan:   1) Acute blood loss, mild anemia - hemodynamically stable and asymptomatic, iron supplementation not  indicated  2) Blood Type --/--/O POS (01/15 0911)    3) Rubella 1.64 (06/29 1510) / Varicella immune / TDAP status: give before discharge  4) Breast and formula feeding.  5) Contraception - undecided  6) Gestational hypertension - has been normotensive since last night at 1700, denies any symptoms of pre-eclampsia.  7) Disposition - Continue postpartum care. May discharge later today pending baby's status.  Avel Sensor, CNM 05/01/2017  9:14 AM

## 2017-05-01 NOTE — Discharge Summary (Signed)
OB Discharge Summary     Patient Name: Anne Mendoza DOB: [birth year] MRN: [id number]  Date of admission: 04/30/2017 Delivering MD: Farrel Connersolleen Denzil Bristol, CNM  Date of Delivery: 04/30/2017  Date of discharge: 05/02/2017 Admitting diagnosis: induction 37 wks preg Intrauterine pregnancy: 227w5d     Secondary diagnosis: Gestational Hypertension     Discharge diagnosis: Term Pregnancy Delivered, Gestational hypertension                         Hospital course:  Induction of Labor With Vaginal Delivery   37 y.o. yo [id number] at 6027w5d was admitted to the hospital 04/30/2017 for induction of labor.  Indication for induction: Gestational hypertension.  Patient had an uncomplicated labor course as follows: Membrane Rupture Time/Date: 11:50 AM ,04/30/2017   Intrapartum Procedures: Episiotomy:   None                                        Lacerations:    Small periurethral, no repair needed Patient had delivery of a Viable 5#15.6oz infant.  Information for the patient's newborn:  Anne Mendoza [[id number]][[id number]]  Delivery Method: Vag-Spont   04/30/2017  Details of delivery can be found in separate delivery note.  Patient had a routine postpartum course. Most blood pressures postpartum were normal. Had an occasional blood pressure in the mild range. Patient is discharged home 05/02/2017                                                                 Post partum procedures:none  Complications: none  Physical exam on 05/02/2017: Vitals:   05/01/17 0746 05/01/17 1600 05/02/17 0001 05/02/17 0756  BP: 139/76 123/60 (!) 144/85 120/84  Pulse: 89 79 83 84  Resp: 20 20 18 19   Temp: 98.4 F (36.9 C) 98.7 F (37.1 C) 98 F (36.7 C) (!) 97.4 F (36.3 C)  TempSrc: Oral Oral Oral Oral  SpO2: 100% 100% 98% 98%  Weight:      Height:       General: comfortable, in NAD Lochia: appropriate Uterine Fundus: firm/ U-1/ML/NT DVT Evaluation: No evidence of DVT seen on physical exam.  Labs: Lab Results   Component Value Date   WBC 13.1 (H) 05/01/2017   HGB 11.3 (L) 05/01/2017   HCT 33.6 (L) 05/01/2017   MCV 89.2 05/01/2017   PLT 223 05/01/2017   CMP Latest Ref Rng & Units 04/30/2017  Glucose 65 - 99 mg/dL 130(Q131(H)  BUN 6 - 20 mg/dL 5(L)  Creatinine 6.570.44 - 1.00 mg/dL 8.460.68  Sodium 962135 - 952145 mmol/L 134(L)  Potassium 3.5 - 5.1 mmol/L 3.6  Chloride 101 - 111 mmol/L 106  CO2 22 - 32 mmol/L 17(L)  Calcium 8.9 - 10.3 mg/dL 9.2  Total Protein 6.5 - 8.1 g/dL 6.8  Total Bilirubin 0.3 - 1.2 mg/dL 0.5  Alkaline Phos 38 - 126 U/L 142(H)  AST 15 - 41 U/L 28  ALT 14 - 54 U/L 15    Discharge instruction: per After Visit Summary.  Medications:  Allergies as of 05/02/2017      Reactions   Nickel Rash      Medication List  STOP taking these medications   zolpidem 5 MG tablet Commonly known as:  AMBIEN     TAKE these medications   ibuprofen 600 MG tablet Commonly known as:  ADVIL,MOTRIN Take 1 tablet (600 mg total) by mouth every 6 (six) hours as needed.   oxyCODONE 5 MG immediate release tablet Commonly known as:  ROXICODONE Take 1 tablet (5 mg total) by mouth every 4 (four) hours as needed for moderate pain or severe pain.   VITAFOL GUMMIES 3.33-0.333-34.8 MG Chew Chew 3 tablets by mouth daily.       Diet: routine diet  Activity: Advance as tolerated. Pelvic rest for 6 weeks.   Outpatient follow up: Follow-up Information    Schuman, Jaquelyn Bitter, MD. Schedule an appointment as soon as possible for a visit in 1 week(s).   Specialty:  Obstetrics and Gynecology Why:  BP check Contact information: [address] [place] [address] [phone]             Postpartum contraception: Undecided Rhogam Given postpartum: no Rubella vaccine given postpartum: no Varicella vaccine given postpartum: no TDaP given antepartum or postpartum: No, last given 06/24/2014  Newborn Data: Live born female -Anne Mendoza Birth Weight: 5 lb 15.6 oz (2710 g) APGAR: 8, 9  Newborn  Delivery   Birth date/time:  04/30/2017 13:56:00 Delivery type:  Vaginal, Spontaneous      Baby Feeding: Bottle and Breast  Disposition:home with mother  SIGNED:  Farrel Conners, CNM 05/02/2017 9:31 AM

## 2017-05-01 NOTE — Lactation Note (Signed)
This note was copied from a baby's chart. Lactation Consultation Note  Patient Name: Anne Mendoza WUJWJ'XToday's Date: 05/01/2017    Mom states that she is wearing breast shells and pumping/bottle feeding as planned. Getting some colostrum and feeding that first and then formula for volume. She says nipples have not everted much since yesterday, but they have "a little" so she tried breastfeeding this morning for a few suckles. I encouraged her to call me if/when she wants help to see if LC can get baby to latch with or without shield. It could takes days or even a couple weeks before baby able to latch well. Meanwhile, feedign baby while working on milk supply are top goals.    Maternal Data    Feeding    LATCH Score                   Interventions    Lactation Tools Discussed/Used     Consult Status      Anne Mendoza 05/01/2017, 1:33 PM

## 2017-05-02 ENCOUNTER — Encounter: Payer: Self-pay | Admitting: Certified Nurse Midwife

## 2017-05-02 ENCOUNTER — Encounter: Payer: Self-pay | Admitting: Obstetrics and Gynecology

## 2017-05-02 MED ORDER — OXYCODONE HCL 5 MG PO TABS: 5.0000 mg | ORAL_TABLET | ORAL | 0 refills | Status: DC | PRN

## 2017-05-02 MED ORDER — IBUPROFEN 600 MG PO TABS: 600.0000 mg | ORAL_TABLET | Freq: Four times a day (QID) | ORAL | 1 refills | Status: DC | PRN

## 2017-05-02 NOTE — Telephone Encounter (Signed)
She can call labor and delivery, it is generally kept in a refrigerator for several days.

## 2017-05-02 NOTE — Progress Notes (Signed)
Reviewed all patients discharge instructions and handouts regarding postpartum bleeding, no intercourse for 6 weeks, signs and symptoms of mastitis and postpartum bleu's. Reviewed discharge instructions for newborn regarding proper cord care, how and when to bathe the newborn, nail care, proper way to take the baby's temperature, along with safe sleep. All questions have been answered at this time. Patient discharged via wheelchair with axillary.  

## 2017-05-08 ENCOUNTER — Encounter: Payer: Self-pay | Admitting: Obstetrics and Gynecology

## 2017-05-08 ENCOUNTER — Ambulatory Visit (INDEPENDENT_AMBULATORY_CARE_PROVIDER_SITE_OTHER): Payer: Medicaid Other | Admitting: Obstetrics and Gynecology

## 2017-05-08 VITALS — BP 138/88 | HR 80 | Ht 70.0 in | Wt 245.0 lb

## 2017-05-08 DIAGNOSIS — O0990 Supervision of high risk pregnancy, unspecified, unspecified trimester: Secondary | ICD-10-CM

## 2017-05-08 DIAGNOSIS — R030 Elevated blood-pressure reading, without diagnosis of hypertension: Secondary | ICD-10-CM

## 2017-05-08 LAB — POCT URINALYSIS DIPSTICK
Bilirubin, UA: NEGATIVE
Blood, UA: POSITIVE
Glucose, UA: NEGATIVE
Ketones, UA: NEGATIVE
LEUKOCYTES UA: NEGATIVE
NITRITE UA: NEGATIVE
PROTEIN UA: NEGATIVE
Spec Grav, UA: 1.01 (ref 1.010–1.025)
Urobilinogen, UA: 0.2 E.U./dL
pH, UA: 6 (ref 5.0–8.0)

## 2017-05-08 NOTE — Progress Notes (Signed)
  OBSTETRICS POSTPARTUM CLINIC PROGRESS NOTE  Subjective:     Lavona MoundJohanna R Kleiman is a 37 y.o. [id number] female who presents for a postpartum visit. She is 1 week postpartum following a Term pregnancy and delivery by Vaginal, no problems at delivery.  I have fully reviewed the prenatal and intrapartum course. Anesthesia: none.  Postpartum course has been complicated by uncomplicated.  Baby is feeding by Breast.  Bleeding: patient has not  resumed menses.  Bowel function is normal. Bladder function is normal.  Patient is not sexually active. Contraception method desired is none.   Patient reported that last week she had a headache which was severe and not improved with medication or sleeping. She says the headache is now resolved. She has felt stiff and swollen since the delivery. Denies shortness of breath. Stated that last week when she was having the headaches that she went and bought a wrist blood pressure meter which she used to take her BP. She said she had readings in the 140/110s but that she used the cuff on her husband and he had the same. Asked patient to bring the cuff with her to office to compare values. Asked the patient to please come to the office or call with concerns to avoid any complications.   The following portions of the patient's history were reviewed and updated as appropriate: allergies, current medications, past family history, past medical history, past social history, past surgical history and problem list.  Review of Systems Pertinent items are noted in HPI.  Objective:    BP 138/88   Pulse 80   Ht 5\' 10"  (1.778 m)   Wt 245 lb (111.1 kg)   BMI 35.15 kg/m   General:  alert and no distress   Breasts:  inspection negative, no nipple discharge or bleeding, no masses or nodularity palpable  Lungs: clear to auscultation bilaterally  Heart:  regular rate and rhythm, S1, S2 normal, no murmur, click, rub or gallop  Abdomen: soft, non-tender; bowel sounds normal; no  masses,  no organomegaly.     Vulva:  normal  Vagina: normal vagina, no discharge, exudate, lesion, or erythema  Cervix:  no cervical motion tenderness and no lesions  Corpus: normal size, contour, position, consistency, mobility, non-tender  Adnexa:  normal adnexa and no mass, fullness, tenderness  Rectal Exam: Not performed.       Pitting edema of bilateral lower extremity to knee   Assessment&Plan:  Post Partum Care visit 1. Elevated blood pressure reading Will have patient return in 1 week for another BP check. Told the patient that if her headache was to reoccur that she should seek treatment in the office or at the hospital. Emphasized the risks of postpartum preeclampsia such as stroke and seizure. Discussed that the risks of preeclampsia can remain for 12 weeks postpartum. Discussed signs of headache, vision changes, and RUQ pain.  2. Birth control Reviewed options with patient. She was given a booklet with more information and referred to beds[facility]org for further information.  - POCT Urinalysis Dipstick: negative for protein Follow up in: 1 week .   Adelene Idlerhristanna Desira Alessandrini MD [facility], [facility], [facility] 05/08/2017  10:47 AM

## 2017-05-08 NOTE — Patient Instructions (Signed)

## 2017-05-16 ENCOUNTER — Ambulatory Visit: Payer: Medicaid Other | Admitting: Obstetrics and Gynecology

## 2017-05-27 ENCOUNTER — Encounter: Payer: Self-pay | Admitting: Obstetrics and Gynecology

## 2017-06-20 ENCOUNTER — Ambulatory Visit: Payer: Medicaid Other | Admitting: Obstetrics and Gynecology

## 2017-06-25 ENCOUNTER — Encounter: Payer: Self-pay | Admitting: Obstetrics and Gynecology

## 2017-06-25 ENCOUNTER — Ambulatory Visit (INDEPENDENT_AMBULATORY_CARE_PROVIDER_SITE_OTHER): Payer: Medicaid Other | Admitting: Obstetrics and Gynecology

## 2017-06-25 VITALS — BP 138/68 | HR 110 | Ht 70.0 in | Wt 228.0 lb

## 2017-06-25 DIAGNOSIS — N941 Unspecified dyspareunia: Secondary | ICD-10-CM

## 2017-06-25 DIAGNOSIS — R030 Elevated blood-pressure reading, without diagnosis of hypertension: Secondary | ICD-10-CM

## 2017-06-25 DIAGNOSIS — Z3009 Encounter for other general counseling and advice on contraception: Secondary | ICD-10-CM

## 2017-06-26 NOTE — Progress Notes (Signed)
  OBSTETRICS POSTPARTUM CLINIC PROGRESS NOTE  Subjective:     Anne MoundJohanna R Godshall is a 37 y.o. [id number] female who presents for a postpartum visit. She is 8 weeks postpartum following a Term pregnancy and delivery by Vaginal, no problems at delivery.  I have fully reviewed the prenatal and intrapartum course. Anesthesia: none.  Postpartum course has been complicated by uncomplicated.  Baby is feeding by Breast.  Bleeding: patient has not  resumed menses.  Bowel function is normal. Bladder function is normal.  Patient is sexually active. She is having dyspareunia with intercourse and activity has been limited. Contraception method desired is none.  Postpartum depression screening: negative. Edinburgh 3.  The following portions of the patient's history were reviewed and updated as appropriate: allergies, current medications, past family history, past medical history, past social history, past surgical history and problem list.  Review of Systems Pertinent items are noted in HPI.  Objective:    BP 138/68 (BP Location: Left Arm, Patient Position: Sitting, Cuff Size: Large)   Pulse (!) 110   Ht 5\' 10"  (1.778 m)   Wt 228 lb (103.4 kg)   Breastfeeding? Yes   BMI 32.71 kg/m   General:  alert and no distress   Breasts:  inspection negative, no nipple discharge or bleeding, no masses or nodularity palpable  Lungs: clear to auscultation bilaterally  Heart:  regular rate and rhythm, S1, S2 normal, no murmur, click, rub or gallop  Abdomen: soft, non-tender; bowel sounds normal; no masses,  no organomegaly.     Vulva:  normal  Vagina: normal vagina, no discharge, exudate, lesion, or erythema Tender to palpation of left and right pelvic muscles. No tenderness of uterus or ovaries.  No posterior or apical prolapse. Anterior wall prolapse to -1  Cervix:  no cervical motion tenderness and no lesions  Corpus: normal size, contour, position, consistency, mobility, non-tender  Adnexa:  normal  adnexa and no mass, fullness, tenderness  Rectal Exam: Not performed.         Assessment:  Post Partum Care visit There are no diagnoses linked to this encounter.  Plan:  See orders and Patient Instructions Follow up in: 1 year or as needed.   Dyspareunia, discussed healing process from vaginal delivery, recommended physical therapy since on exam it appearsed that the muscles of the pelvis were tender to palpation. Recommended kegels exercises as well. Patient is unsure if she will follow up for the referral to physical therapy.   Reviewed options for contraception. Patient is not interested in contraception at this time.  Discussed healthy diet and caloric requirements while breast feeding.   Discussed with the patient the risks of hypertension such as cardiovascular disease, stroke, ophthalmic complications, renal disease, etc. Asked patient to establish care   Marshayla Mitschke Casper HarrisonSchuman [facility], [facility] 06/26/2017  11:45 AM

## 2017-08-07 ENCOUNTER — Encounter: Payer: Self-pay | Admitting: Maternal Newborn

## 2022-03-18 ENCOUNTER — Ambulatory Visit
Admission: EM | Admit: 2022-03-18 | Discharge: 2022-03-18 | Disposition: A | Discharge status: Home / Self Care | Payer: 59 | Attending: Internal Medicine | Admitting: Internal Medicine

## 2022-03-18 ENCOUNTER — Encounter: Payer: Self-pay | Admitting: Emergency Medicine

## 2022-03-18 DIAGNOSIS — J069 Acute upper respiratory infection, unspecified: Secondary | ICD-10-CM

## 2022-03-18 DIAGNOSIS — R0789 Other chest pain: Secondary | ICD-10-CM

## 2022-03-18 DIAGNOSIS — Z20828 Contact with and (suspected) exposure to other viral communicable diseases: Secondary | ICD-10-CM

## 2022-03-18 MED ORDER — ALBUTEROL SULFATE HFA 108 (90 BASE) MCG/ACT IN AERS: 1.0000 | INHALATION_SPRAY | Freq: Four times a day (QID) | RESPIRATORY_TRACT | 0 refills | Status: DC | PRN

## 2022-03-18 NOTE — ED Provider Notes (Signed)
[facility]    CSN: [id number] Arrival date & time: 03/18/22  1007      History   Chief Complaint Chief Complaint  Patient presents with   Cough    Chest tightness    HPI Anne Mendoza is a 41 y.o. female.   Patient presents with nasal drainage, mild nonproductive cough, feelings of chest tightness that started about 3 days ago.  Patient reports that her children recently tested positive for the flu.  She denies chest pain or shortness of breath but reports "chest tightness".  She denies history of asthma or COPD and she does not smoke cigarettes.  Denies sore throat, ear pain, nausea, vomiting, diarrhea, abdominal pain.  She has not taken any medications to alleviate symptoms.   Cough   Past Medical History:  Diagnosis Date   Pre-eclampsia     Patient Active Problem List   Diagnosis Date Noted   Postpartum care following vaginal delivery 05/02/2017   Gestational hypertension 04/30/2017   Vaginal delivery 04/30/2017   Elevated blood pressure affecting pregnancy in third trimester, antepartum 03/22/2017   Supervision of high risk pregnancy, antepartum 10/12/2016    Past Surgical History:  Procedure Laterality Date   CHOLECYSTECTOMY      OB History     Gravida  5   Para  5   Term  4   Preterm  0   AB  0   Living  5      SAB  0   IAB  0   Ectopic  0   Multiple  0   Live Births  5            Home Medications    Prior to Admission medications   Medication Sig Start Date End Date Taking? Authorizing Provider  albuterol (VENTOLIN HFA) 108 (90 Base) MCG/ACT inhaler Inhale 1-2 puffs into the lungs every 6 (six) hours as needed for wheezing or shortness of breath. 03/18/22  Yes Jaxyn Mestas, Acie Fredrickson, FNP    Family History Family History  Problem Relation Age of Onset   Lung cancer Maternal Uncle    Brain cancer Cousin    Breast cancer Cousin     Social History Social History   Tobacco Use   Smoking status: Never    Smokeless tobacco: Never  Vaping Use   Vaping Use: Never used  Substance Use Topics   Alcohol use: No   Drug use: No     Allergies   Nickel   Review of Systems Review of Systems Per HPI  Physical Exam Triage Vital Signs ED Triage Vitals  Enc Vitals Group     BP 03/18/22 1256 130/86     Pulse Rate 03/18/22 1256 85     Resp 03/18/22 1256 18     Temp 03/18/22 1256 98.5 F (36.9 C)     Temp src --      SpO2 03/18/22 1256 97 %     Weight --      Height --      Head Circumference --      Peak Flow --      Pain Score 03/18/22 1255 0     Pain Loc --      Pain Edu? --      Excl. in GC? --    No data found.  Updated Vital Signs BP 130/86   Pulse 85   Temp 98.5 F (36.9 C)   Resp 18   SpO2 97%  Breastfeeding No   Visual Acuity Right Eye Distance:   Left Eye Distance:   Bilateral Distance:    Right Eye Near:   Left Eye Near:    Bilateral Near:     Physical Exam Constitutional:      General: She is not in acute distress.    Appearance: Normal appearance. She is not toxic-appearing or diaphoretic.  HENT:     Head: Normocephalic and atraumatic.     Right Ear: Tympanic membrane and ear canal normal.     Left Ear: Tympanic membrane and ear canal normal.     Nose: Congestion present.     Mouth/Throat:     Mouth: Mucous membranes are moist.     Pharynx: No posterior oropharyngeal erythema.  Eyes:     Extraocular Movements: Extraocular movements intact.     Conjunctiva/sclera: Conjunctivae normal.     Pupils: Pupils are equal, round, and reactive to light.  Cardiovascular:     Rate and Rhythm: Normal rate and regular rhythm.     Pulses: Normal pulses.     Heart sounds: Normal heart sounds.  Pulmonary:     Effort: Pulmonary effort is normal. No respiratory distress.     Breath sounds: Normal breath sounds. No stridor. No wheezing, rhonchi or rales.  Abdominal:     General: Abdomen is flat. Bowel sounds are normal.     Palpations: Abdomen is soft.   Musculoskeletal:        General: Normal range of motion.     Cervical back: Normal range of motion.  Skin:    General: Skin is warm and dry.  Neurological:     General: No focal deficit present.     Mental Status: She is alert and oriented to person, place, and time. Mental status is at baseline.  Psychiatric:        Mood and Affect: Mood normal.        Behavior: Behavior normal.      UC Treatments / Results  Labs (all labs ordered are listed, but only abnormal results are displayed) Labs Reviewed - No data to display  EKG   Radiology No results found.  Procedures Procedures (including critical care time)  Medications Ordered in UC Medications - No data to display  Initial Impression / Assessment and Plan / UC Course  I have reviewed the triage vital signs and the nursing notes.  Pertinent labs & imaging results that were available during my care of the patient were reviewed by me and considered in my medical decision making (see chart for details).     Patient presents with symptoms likely from a viral upper respiratory infection. Differential includes bacterial pneumonia, sinusitis, allergic rhinitis, COVID-19, flu, RSV.  Highly suspicious the flu given patient's close exposure. Patient is nontoxic appearing and not in need of emergent medical intervention.  Patient offered viral testing including flu testing but she declined.  Recommended symptom control with over the counter medications: Daily oral anti-histamine, Oral decongestant or IN corticosteroid, saline irrigations, cepacol lozenges, Robitussin, Delsym, honey tea.  Patient sent albuterol inhaler to alleviate chest tightness.  No adventitious lung sounds on exam or signs of respiratory compromise so do not think that chest imaging is necessary.  Return if symptoms fail to improve. Patient states understanding and is agreeable.  Discharged with PCP followup.  Final Clinical Impressions(s) / UC Diagnoses    Final diagnoses:  Viral upper respiratory tract infection with cough  Exposure to the flu  Chest tightness  Discharge Instructions      It appears that you have a viral illness similar to your children.  I have prescribed an inhaler to help alleviate your chest tightness.  Please follow-up if your symptoms persist or worsen.    ED Prescriptions     Medication Sig Dispense Auth. Provider   albuterol (VENTOLIN HFA) 108 (90 Base) MCG/ACT inhaler Inhale 1-2 puffs into the lungs every 6 (six) hours as needed for wheezing or shortness of breath. 1 each Gustavus Bryant, [place]      PDMP not reviewed this encounter.   Gustavus Bryant, [place] 03/18/22 1343

## 2022-03-18 NOTE — ED Triage Notes (Signed)
Pt is present today with c/o chest tightness and slight cough. Pt sx started x3 days ago. Pt states that she has been exposed to flu and pneumonia in the home.

## 2022-03-18 NOTE — Discharge Instructions (Signed)
It appears that you have a viral illness similar to your children.  I have prescribed an inhaler to help alleviate your chest tightness.  Please follow-up if your symptoms persist or worsen.

## 2022-07-01 ENCOUNTER — Encounter: Payer: Self-pay | Admitting: Family Medicine

## 2022-08-13 ENCOUNTER — Other Ambulatory Visit (HOSPITAL_COMMUNITY)
Admission: RE | Admit: 2022-08-13 | Discharge: 2022-08-13 | Disposition: A | Discharge status: Home / Self Care | Payer: Commercial Managed Care - PPO | Source: Ambulatory Visit | Attending: Obstetrics and Gynecology | Admitting: Obstetrics and Gynecology

## 2022-08-13 ENCOUNTER — Ambulatory Visit: Payer: Commercial Managed Care - PPO | Admitting: Obstetrics and Gynecology

## 2022-08-13 ENCOUNTER — Encounter: Payer: Self-pay | Admitting: Obstetrics and Gynecology

## 2022-08-13 VITALS — BP 138/84 | HR 75 | Ht 69.0 in | Wt 234.0 lb

## 2022-08-13 DIAGNOSIS — Z124 Encounter for screening for malignant neoplasm of cervix: Secondary | ICD-10-CM

## 2022-08-13 DIAGNOSIS — Z803 Family history of malignant neoplasm of breast: Secondary | ICD-10-CM | POA: Diagnosis not present

## 2022-08-13 DIAGNOSIS — Z01419 Encounter for gynecological examination (general) (routine) without abnormal findings: Secondary | ICD-10-CM

## 2022-08-13 DIAGNOSIS — Z3009 Encounter for other general counseling and advice on contraception: Secondary | ICD-10-CM | POA: Diagnosis not present

## 2022-08-13 DIAGNOSIS — R030 Elevated blood-pressure reading, without diagnosis of hypertension: Secondary | ICD-10-CM

## 2022-08-13 NOTE — Progress Notes (Unsigned)
Obstetrics and Gynecology New Patient Evaluation  Appointment Date: 08/13/2022  OBGYN Clinic: [facility]  Primary Care Provider: None  Referring Provider: Self  Chief Complaint:  Chief Complaint  Patient presents with   Gynecologic Exam    History of Present Illness: Anne Mendoza is a 42 y.o. G5P4005 (Patient's last menstrual period was 07/31/2022.), seen for the above chief complaint. Her past medical history is significant for ?HTN, BMI 35  Patient had episode of breakthrough bleeding in March. Her cycles are normally 21 days in length and lasts for a week and can be somewhat heavy and painful; she had the BTB episode about two wks after the first day of her LMP in March and it lasted for a few days. No pain associated with it and it started after intercourse. She had a normal period in April and she also has had intercourse since then and no repeat s/s.   Contraception: patient interested in various options  Review of Systems: Pertinent items noted in HPI and remainder of comprehensive ROS otherwise negative.   Past Medical History:  Past Medical History:  Diagnosis Date   Pre-eclampsia     Past Surgical History:  Past Surgical History:  Procedure Laterality Date   CHOLECYSTECTOMY      Past Obstetrical History:  OB History  Gravida Para Term Preterm AB Living  5 5 4  0 0 5  SAB IAB Ectopic Multiple Live Births  0 0 0 0 5    # Outcome Date GA Lbr Len/2nd Weight Sex Delivery Anes PTL Lv  5 Term 04/30/17 [redacted]w[redacted]d 02:05 / 00:01 5 lb 15.6 oz (2.71 kg) F Vag-Spont None  LIV  4 Para 08/19/14  01:59 / 00:05 7 lb 0.5 oz (3.189 kg) M Vag-Spont None  LIV     Birth Comments: 37wk5d gestation  3 Term 08/02/11 [redacted]w[redacted]d  6 lb 13 oz (3.09 kg) F Vag-Spont   LIV  2 Term 04/03/08 [redacted]w[redacted]d  7 lb 5 oz (3.317 kg) M Vag-Spont   LIV     Complications: Gestational hypertension  1 Term 03/16/07 [redacted]w[redacted]d  6 lb (2.722 kg) F Vag-Spont   LIV     Complications:  Gestational hypertension   Past Gynecological History: As per HPI. History of Pap Smear(s): Yes.   With her last pregnancy She is currently using condoms for contraception.   Social History:  Social History   Socioeconomic History   Marital status: Married    Spouse name: Not on file   Number of children: Not on file   Years of education: Not on file   Highest education level: Not on file  Occupational History   Not on file  Tobacco Use   Smoking status: Never   Smokeless tobacco: Never  Vaping Use   Vaping Use: Never used  Substance and Sexual Activity   Alcohol use: Yes    Comment: socially   Drug use: No   Sexual activity: Yes    Birth control/protection: None    Comment: Still deciding  Other Topics Concern   Not on file  Social History Narrative   Not on file   Social Determinants of Health   Financial Resource Strain: Not on file  Food Insecurity: Not on file  Transportation Needs: Not on file  Physical Activity: Not on file  Stress: Not on file  Social Connections: Not on file  Intimate Partner Violence: Not on file    Family History:  Family History  Problem Relation  Age of Onset   Lung cancer Maternal Uncle    Brain cancer Cousin    Breast cancer Cousin    Maternal 1st cousin diagnosed   Health Maintenance:  Medications Zaeda R. Kuhrt had no medications administered during this visit. Current Outpatient Medications  Medication Sig Dispense Refill   albuterol (VENTOLIN HFA) 108 (90 Base) MCG/ACT inhaler Inhale 1-2 puffs into the lungs every 6 (six) hours as needed for wheezing or shortness of breath. 1 each 0   No current facility-administered medications for this visit.    Allergies Nickel   Physical Exam:  BP 138/84   Pulse 75   Ht 5\' 9"  (1.753 m)   Wt 234 lb (106.1 kg)   LMP 07/31/2022   BMI 34.56 kg/m  Body mass index is 34.56 kg/m. General appearance: Well nourished, well developed female in no acute distress.  Neck:   Supple, normal appearance, and no thyromegaly  Cardiovascular: normal s1 and s2.  No murmurs, rubs or gallops. Respiratory:  Clear to auscultation bilateral. Normal respiratory effort Abdomen: positive bowel sounds and no masses, hernias; diffusely non tender to palpation, non distended Breasts: breasts appear normal, no suspicious masses, no skin or nipple changes or axillary nodes, and normal palpation. Neuro/Psych:  Normal mood and affect.  Skin:  Warm and dry.  Lymphatic:  No inguinal lymphadenopathy.   Cervical exam performed in the presence of a chaperone Pelvic exam: is not limited by body habitus EGBUS: within normal limits Vagina: within normal limits and with no blood or discharge in the vault Cervix: normal appearing cervix without tenderness, discharge or lesions. Small nabothian cyst at 1 o'clock Uterus:  nonenlarged and non tender Adnexa:  normal adnexa and no mass, fullness, tenderness Rectovaginal: deferred  Laboratory: none  Radiology: none  Assessment: patient doing well  Plan:  1. Transient hypertension Repeat BP wnl. Patient does not have a PCP. I showed her on the [facility] homepage how to set up an appointment with one  2. Cervical cancer screening - Cytology - PAP( [place])  3. Well woman exam with routine gynecological exam - Lipid panel - Hemoglobin A1c - TSH Rfx on Abnormal to Free T4 - CBC - Comprehensive metabolic panel - Urinalysis, Routine w reflex microscopic  4. Family history of breast cancer Patient amenable to genetic counseling visit. Per Dondra Spry model, cancer risk 16.9 vs 12.4% lifetime breast cancer risk. Patient offered mammogram screening now vs age 40.   5. Contraception management Options discussed and pt to consider options.   RTC PRN   Cornelia Copa MD [facility]ending [facility] [employer] [facility])

## 2022-08-13 NOTE — Progress Notes (Signed)
Patient presents for Annual.  LMP: 07/31/2022 Last pap:  >37yrs No Hx of abnormal paps Contraception:  None wld like to discuss Mammogram: Not yet indicated Family Hx of Breast Cancer: M.Cousin recently passed Dx in 40's STD Screening: Declines   CC: Breakthrough bleeding in between periods started in March has never happen before.  Fun Fact: Patient loves reading.

## 2022-08-14 LAB — TSH RFX ON ABNORMAL TO FREE T4: TSH: 0.508 u[IU]/mL (ref 0.450–4.500)

## 2022-08-14 LAB — COMPREHENSIVE METABOLIC PANEL
ALT: 14 IU/L (ref 0–32)
AST: 17 IU/L (ref 0–40)
Albumin/Globulin Ratio: 1.6 (ref 1.2–2.2)
Albumin: 4.4 g/dL (ref 3.9–4.9)
Alkaline Phosphatase: 92 IU/L (ref 44–121)
BUN/Creatinine Ratio: 10 (ref 9–23)
BUN: 10 mg/dL (ref 6–20)
Bilirubin Total: <0.2 mg/dL (ref 0.0–1.2)
CO2: 20 mmol/L (ref 20–29)
Calcium: 9.7 mg/dL (ref 8.7–10.2)
Chloride: 104 mmol/L (ref 96–106)
Creatinine, Ser: 0.99 mg/dL (ref 0.57–1.00)
Globulin, Total: 2.7 g/dL (ref 1.5–4.5)
Glucose: 90 mg/dL (ref 70–99)
Potassium: 4.2 mmol/L (ref 3.5–5.2)
Sodium: 138 mmol/L (ref 134–144)
Total Protein: 7.1 g/dL (ref 6.0–8.5)
eGFR: 74 mL/min/{1.73_m2} (ref >59)

## 2022-08-14 LAB — LIPID PANEL
Chol/HDL Ratio: 3.7 ratio (ref 0.0–4.4)
Cholesterol, Total: 226 mg/dL — ABNORMAL HIGH (ref 100–199)
HDL: 61 mg/dL (ref >39)
LDL Chol Calc (NIH): 151 mg/dL — ABNORMAL HIGH (ref 0–99)
Triglycerides: 81 mg/dL (ref 0–149)
VLDL Cholesterol Cal: 14 mg/dL (ref 5–40)

## 2022-08-14 LAB — CBC
Hematocrit: 39.7 % (ref 34.0–46.6)
Hemoglobin: 12.8 g/dL (ref 11.1–15.9)
MCH: 27.2 pg (ref 26.6–33.0)
MCHC: 32.2 g/dL (ref 31.5–35.7)
MCV: 85 fL (ref 79–97)
Platelets: 396 10*3/uL (ref 150–450)
RBC: 4.7 x10E6/uL (ref 3.77–5.28)
RDW: 13.2 % (ref 11.7–15.4)
WBC: 12.1 10*3/uL — ABNORMAL HIGH (ref 3.4–10.8)

## 2022-08-14 LAB — URINALYSIS, ROUTINE W REFLEX MICROSCOPIC
Bilirubin, UA: NEGATIVE
Glucose, UA: NEGATIVE
Ketones, UA: NEGATIVE
Leukocytes,UA: NEGATIVE
Nitrite, UA: NEGATIVE
Protein,UA: NEGATIVE
RBC, UA: NEGATIVE
Specific Gravity, UA: 1.018 (ref 1.005–1.030)
Urobilinogen, Ur: 0.2 mg/dL (ref 0.2–1.0)
pH, UA: 6.5 (ref 5.0–7.5)

## 2022-08-14 LAB — HEMOGLOBIN A1C
Est. average glucose Bld gHb Est-mCnc: 117 mg/dL
Hgb A1c MFr Bld: 5.7 % — ABNORMAL HIGH (ref 4.8–5.6)

## 2022-08-15 LAB — CYTOLOGY - PAP
Comment: NEGATIVE
Diagnosis: NEGATIVE
High risk HPV: NEGATIVE

## 2022-08-16 ENCOUNTER — Encounter: Payer: Self-pay | Admitting: Obstetrics and Gynecology

## 2022-08-16 DIAGNOSIS — R030 Elevated blood-pressure reading, without diagnosis of hypertension: Secondary | ICD-10-CM | POA: Insufficient documentation

## 2022-08-16 DIAGNOSIS — Z803 Family history of malignant neoplasm of breast: Secondary | ICD-10-CM | POA: Insufficient documentation

## 2022-09-05 ENCOUNTER — Inpatient Hospital Stay: Payer: Commercial Managed Care - PPO

## 2022-09-05 ENCOUNTER — Encounter: Payer: Self-pay | Admitting: Licensed Clinical Social Worker

## 2022-09-05 ENCOUNTER — Inpatient Hospital Stay: Payer: Commercial Managed Care - PPO | Attending: Oncology | Admitting: Licensed Clinical Social Worker

## 2022-09-05 DIAGNOSIS — Z801 Family history of malignant neoplasm of trachea, bronchus and lung: Secondary | ICD-10-CM

## 2022-09-05 DIAGNOSIS — Z803 Family history of malignant neoplasm of breast: Secondary | ICD-10-CM

## 2022-09-05 DIAGNOSIS — Z808 Family history of malignant neoplasm of other organs or systems: Secondary | ICD-10-CM

## 2022-09-05 DIAGNOSIS — Z800 Family history of malignant neoplasm of digestive organs: Secondary | ICD-10-CM

## 2022-09-05 NOTE — Progress Notes (Signed)
REFERRING PROVIDER: Bluford Bing, MD [address] [place],  [place] [address]  PRIMARY PROVIDER:  Patient, No Pcp Per  PRIMARY REASON FOR VISIT:  1. Family history of breast cancer   2. Family history of pancreatic cancer   3. Family history of colon cancer   4. Family history of lung cancer   5. Family history of brain cancer      HISTORY OF PRESENT ILLNESS:   Anne Mendoza, a 42 y.o. female, was seen for a [facility] cancer genetics consultation at the request of Dr. Vergie Living due to a family history of cancer.  Anne Mendoza presents to clinic today to discuss the possibility of a hereditary predisposition to cancer, genetic testing, and to further clarify her future cancer risks, as well as potential cancer risks for family members.   CANCER HISTORY:  Anne Mendoza is a 42 y.o. female with no personal history of cancer.    RISK FACTORS:  Menarche was at age 39.  First live birth at age 36.  Ovaries intact: yes.  Hysterectomy: no.  Menopausal status: premenopausal.  Colonoscopy: no; not examined. Mammogram within the last year: no. Number of breast biopsies: 0. Up to date with pelvic exams: yes.  Past Medical History:  Diagnosis Date   Gestational hypertension 04/30/2017   Pre-eclampsia     Past Surgical History:  Procedure Laterality Date   CHOLECYSTECTOMY      FAMILY HISTORY:  We obtained a detailed, 4-generation family history.  Significant diagnoses are listed below: Family History  Problem Relation Age of Onset   Prostate cancer Father    Pancreatic cancer Maternal Aunt    Lung cancer Maternal Uncle    Prostate cancer Paternal Uncle    Colon cancer Maternal Grandmother        d. 87s   Lung cancer Maternal Grandfather        d. 75s   Prostate cancer Paternal Grandfather    Breast cancer Cousin        dx 103s d. 75s   Breast cancer Cousin        dx 72s, "gene positive"   Brain cancer Cousin    Leukemia Cousin    Anne Mendoza has 3  daughters and 2 sons. She has 7 maternal half siblings and paternal half siblings as well, none have had cancer.   Anne Mendoza mother is living at 77 and does not want to have genetic testing. Maternal aunt has pancreatic cancer. A maternal uncle died of lung cancer, history of smoking. Another aunt had skin cancer. A cousin had breast cancer in her 44s and died in her 64s. Another cousin had breast cancer in her 71s, and her two siblings had brain cancer and leukemia. Maternal grandmother had colon cancer and died in her 35s. Grandfather died of lung cancer in his 71s, history of smoking.  Anne Mendoza father had prostate cancer, unknown age of diagnosis. A paternal uncle also had prostate cancer as well as paternal grandfather. No other known cancers on this side of the family.  Anne Mendoza is unaware of previous family history of genetic testing for hereditary cancer risks. There is no reported Ashkenazi Jewish ancestry. There is no known consanguinity.    GENETIC COUNSELING ASSESSMENT: Anne Mendoza is a 42 y.o. female with a family history of breast, pancreatic and colon cancer which is somewhat suggestive of a hereditary cancer syndrome and predisposition to cancer. We, therefore, discussed and recommended the following at today's visit.  DISCUSSION: We discussed that approximately 10% of breast cancer is hereditary. Most cases of hereditary breast cancer are associated with BRCA1/BRCA2 genes, although there are other genes associated with hereditary cancer as well. She couldn't recall which gene her cousin tested positive for and does not think she will be able to get a copy of her report. Cancers and risks are gene specific. We discussed that testing is beneficial for several reasons including knowing about cancer risks, identifying potential screening and risk-reduction options that may be appropriate, and to understand if other family members could be at risk for cancer and allow them to  undergo genetic testing.   We reviewed the characteristics, features and inheritance patterns of hereditary cancer syndromes. We also discussed genetic testing, including the appropriate family members to test, the process of testing, insurance coverage and turn-around-time for results. We discussed the implications of a negative, positive and/or variant of uncertain significant result. We recommended Anne Mendoza pursue genetic testing for the [facility] Multi-Cancer+RNA gene panel.   Based on Anne Mendoza's family history of cancer, she meets medical criteria for genetic testing. Despite that she meets criteria, she may still have an out of pocket cost.  PLAN: After considering the risks, benefits, and limitations, Anne Mendoza provided informed consent to pursue genetic testing and the blood sample was sent to [facility] for analysis of the Multi-Cancer+RNA panel. Results should be available within approximately 2-3 weeks' time, at which point they will be disclosed by telephone to Anne Mendoza, as will any additional recommendations warranted by these results. Anne Mendoza will receive a summary of her genetic counseling visit and a copy of her results once available. This information will also be available in Epic.   Anne Mendoza questions were answered to her satisfaction today. Our contact information was provided should additional questions or concerns arise. Thank you for the referral and allowing [place] to share in the care of your patient.   Lacy Duverney, MS, [facility] Genetic Counselor [email or web address] Phone: [phone]  The patient was seen for a total of 30 minutes in face-to-face genetic counseling.  Dr. Orlie Dakin was available for discussion regarding this case.   _______________________________________________________________________ For Office Staff:  Number of people involved in session: 1 Was an Intern/ student involved with case: no

## 2022-09-06 ENCOUNTER — Encounter: Payer: Self-pay | Admitting: Obstetrics and Gynecology

## 2022-09-07 ENCOUNTER — Encounter: Payer: Self-pay | Admitting: Family Medicine

## 2022-09-17 ENCOUNTER — Telehealth: Payer: Self-pay | Admitting: Licensed Clinical Social Worker

## 2022-09-18 ENCOUNTER — Encounter: Payer: Self-pay | Admitting: Obstetrics and Gynecology

## 2022-09-18 DIAGNOSIS — D72829 Elevated white blood cell count, unspecified: Secondary | ICD-10-CM | POA: Insufficient documentation

## 2022-09-18 DIAGNOSIS — R7303 Prediabetes: Secondary | ICD-10-CM | POA: Insufficient documentation

## 2022-09-27 ENCOUNTER — Ambulatory Visit: Payer: Self-pay | Admitting: Licensed Clinical Social Worker

## 2022-09-27 ENCOUNTER — Encounter: Payer: Self-pay | Admitting: Licensed Clinical Social Worker

## 2022-09-27 DIAGNOSIS — Z1379 Encounter for other screening for genetic and chromosomal anomalies: Secondary | ICD-10-CM | POA: Insufficient documentation

## 2022-09-27 NOTE — Progress Notes (Signed)
HPI:   Ms. Strain was previously seen in the [facility] due to a family history of cancer and concerns regarding a hereditary predisposition to cancer. Please refer to our prior cancer genetics clinic note for more information regarding our discussion, assessment and recommendations, at the time. Ms. Leder recent genetic test results were disclosed to her, as were recommendations warranted by these results. These results and recommendations are discussed in more detail below.  CANCER HISTORY:  Oncology History   No history exists.    FAMILY HISTORY:  We obtained a detailed, 4-generation family history.  Significant diagnoses are listed below: Family History  Problem Relation Age of Onset   Prostate cancer Father    Pancreatic cancer Maternal Aunt    Lung cancer Maternal Uncle    Prostate cancer Paternal Uncle    Colon cancer Maternal Grandmother        d. 24s   Lung cancer Maternal Grandfather        d. 49s   Prostate cancer Paternal Grandfather    Breast cancer Cousin        dx 81s d. 59s   Breast cancer Cousin        dx 50s, "gene positive"   Brain cancer Cousin    Leukemia Cousin       Ms. Blumenberg has 3 daughters and 2 sons. She has 7 maternal half siblings and paternal half siblings as well, none have had cancer.    Ms. Matar mother is living at 76 and does not want to have genetic testing. Maternal aunt has pancreatic cancer. A maternal uncle died of lung cancer, history of smoking. Another aunt had skin cancer. A cousin had breast cancer in her 60s and died in her 61s. Another cousin had breast cancer in her 66s, and her two siblings had brain cancer and leukemia. Maternal grandmother had colon cancer and died in her 77s. Grandfather died of lung cancer in his 61s, history of smoking.   Ms. Low father had prostate cancer, unknown age of diagnosis. A paternal uncle also had prostate cancer as well as paternal grandfather. No other  known cancers on this side of the family.   Ms. Kleckley is unaware of previous family history of genetic testing for hereditary cancer risks. There is no reported Ashkenazi Jewish ancestry. There is no known consanguinity.     GENETIC TEST RESULTS:  The [facility] Multi-Cancer+RNA Panel found no pathogenic mutations.   The Multi-Cancer + RNA Panel offered by [facility] includes sequencing and/or deletion/duplication analysis of the following 70 genes:  AIP*, ALK, APC*, ATM*, AXIN2*, BAP1*, BARD1*, BLM*, BMPR1A*, BRCA1*, BRCA2*, BRIP1*, CDC73*, CDH1*, CDK4, CDKN1B*, CDKN2A, CHEK2*, CTNNA1*, DICER1*, EPCAM, EGFR, FH*, FLCN*, GREM1, HOXB13, KIT, LZTR1, MAX*, MBD4, MEN1*, MET, MITF, MLH1*, MSH2*, MSH3*, MSH6*, MUTYH*, NF1*, NF2*, NTHL1*, PALB2*, PDGFRA, PMS2*, POLD1*, POLE*, POT1*, PRKAR1A*, PTCH1*, PTEN*, RAD51C*, RAD51D*, RB1*, RET, SDHA*, SDHAF2*, SDHB*, SDHC*, SDHD*, SMAD4*, SMARCA4*, SMARCB1*, SMARCE1*, STK11*, SUFU*, TMEM127*, TP53*, TSC1*, TSC2*, VHL*. RNA analysis is performed for * genes.   The test report has been scanned into EPIC and is located under the Molecular Pathology section of the Results Review tab.  A portion of the result report is included below for reference. Genetic testing reported out on 09/13/2022.      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  At this time, it is unknown if this variant is associated with an increased risk for cancer or if  it is benign, but most uncertain variants are reclassified to benign. It should not be used to make medical management decisions. With time, we suspect the laboratory will determine the significance of this variant, if any. If the laboratory reclassifies this variant, we will attempt to contact Ms. Marcel to discuss it further.   Even though a pathogenic variant was not identified, possible explanations for the cancer in the family may include: There may be no hereditary risk for  cancer in the family. The cancers in Ms. Darden and/or her family may be sporadic/familial or due to other genetic and environmental factors. There may be a gene mutation in one of these genes that current testing methods cannot detect but that chance is small. There could be another gene that has not yet been discovered, or that we have not yet tested, that is responsible for the cancer diagnoses in the family.  It is also possible there is a hereditary cause for the cancer in the family that Ms. Zachar did not inherit. The variant of uncertain significance detected in the ALK gene may be reclassified as a pathogenic variant in the future. At this time, we do not know if this variant increases the risk for cancer.  Therefore, it is important to remain in touch with cancer genetics in the future so that we can continue to offer Ms. Iribe the most up to date genetic testing.   ADDITIONAL GENETIC TESTING:  We discussed with Ms. Mccloskey that her genetic testing was fairly extensive.  If there are additional relevant genes identified to increase cancer risk that can be analyzed in the future, we would be happy to discuss and coordinate this testing at that time.    CANCER SCREENING RECOMMENDATIONS:  Ms. Fitts test result is considered negative (normal).  This means that we have not identified a hereditary cause for her family history of cancer at this time.   An individual's cancer risk and medical management are not determined by genetic test results alone. Overall cancer risk assessment incorporates additional factors, including personal medical history, family history, and any available genetic information that may result in a personalized plan for cancer prevention and surveillance. Therefore, it is recommended she continue to follow the cancer management and screening guidelines provided by her oncology and primary healthcare provider.  Based on Ms. Zaragoza's personal and family  history of cancer as well as her genetic test results, risk model Rockne Menghini was used to estimate her risk of developing breast cancer. This estimates her lifetime risk of developing breast cancer to be approximately 12.7%.  The patient's lifetime breast cancer risk is a preliminary estimate based on available information using one of several models endorsed by the [employer] (NCCN). The NCCN recommends consideration of breast MRI screening as an adjunct to mammography for patients at high risk (defined as 20% or greater lifetime risk).  This risk estimate can change over time and may be repeated to reflect new information in her personal or family history in the future.  RECOMMENDATIONS FOR FAMILY MEMBERS:   Since she did not inherit a identifiable mutation in a cancer predisposition gene included on this panel, her children could not have inherited a known mutation from her in one of these genes. Individuals in this family might be at some increased risk of developing cancer, over the general population risk, due to the family history of cancer.  Individuals in the family should notify their providers of the family history of cancer.  We recommend women in this family have a yearly mammogram beginning at age 49, or 41 years younger than the earliest onset of cancer, an annual clinical breast exam, and perform monthly breast self-exams.  Family members should have colonoscopies by at age 2, or earlier, as recommended by their providers. Other members of the family may still carry a pathogenic variant in one of these genes that Ms. Dubeau did not inherit. Based on the family history, we recommend her maternal relatives have genetic counseling and testing. Ms. Guse will let us know if we can be of any assistance in coordinating genetic counseling and/or testing for this family member.   We do not recommend familial testing for the ALK variant of uncertain significance  (VUS).  FOLLOW-UP:  Lastly, we discussed with Ms. Pugh that cancer genetics is a rapidly advancing field and it is possible that new genetic tests will be appropriate for her and/or her family members in the future. We encouraged her to remain in contact with cancer genetics on an annual basis so we can update her personal and family histories and let her know of advances in cancer genetics that may benefit this family.   Our contact number was provided. Ms. Ten questions were answered to her satisfaction, and she knows she is welcome to call us at anytime with additional questions or concerns.    Lacy Duverney, MS, [facility] Genetic Counselor [email or web address] Phone: [phone]

## 2022-09-27 NOTE — Telephone Encounter (Signed)
I contacted Ms. Landi to discuss her genetic testing results. No pathogenic variants were identified in the 70 genes analyzed. Detailed clinic note to follow.   The test report has been scanned into EPIC and is located under the Molecular Pathology section of the Results Review tab.  A portion of the result report is included below for reference.      Lacy Duverney, MS, [facility] Genetic Counselor [email or web address] Phone: [phone]

## 2023-02-11 ENCOUNTER — Other Ambulatory Visit: Payer: Self-pay | Admitting: *Deleted

## 2023-02-11 DIAGNOSIS — Z1231 Encounter for screening mammogram for malignant neoplasm of breast: Secondary | ICD-10-CM

## 2023-02-14 ENCOUNTER — Other Ambulatory Visit: Payer: Self-pay | Admitting: Physician Assistant

## 2023-02-14 DIAGNOSIS — Z1231 Encounter for screening mammogram for malignant neoplasm of breast: Secondary | ICD-10-CM

## 2023-03-12 ENCOUNTER — Ambulatory Visit
Admission: RE | Admit: 2023-03-12 | Discharge: 2023-03-12 | Disposition: A | Discharge status: Home / Self Care | Payer: Commercial Managed Care - PPO | Source: Ambulatory Visit | Attending: Physician Assistant | Admitting: Physician Assistant

## 2023-03-12 DIAGNOSIS — Z1231 Encounter for screening mammogram for malignant neoplasm of breast: Secondary | ICD-10-CM

## 2023-12-25 ENCOUNTER — Ambulatory Visit (INDEPENDENT_AMBULATORY_CARE_PROVIDER_SITE_OTHER): Payer: Self-pay | Admitting: Family Medicine

## 2023-12-25 ENCOUNTER — Encounter: Payer: Self-pay | Admitting: Family Medicine

## 2023-12-25 VITALS — BP 137/83 | HR 88 | Ht 69.0 in | Wt 252.0 lb

## 2023-12-25 DIAGNOSIS — I10 Essential (primary) hypertension: Secondary | ICD-10-CM | POA: Diagnosis not present

## 2023-12-25 DIAGNOSIS — Z7689 Persons encountering health services in other specified circumstances: Secondary | ICD-10-CM

## 2023-12-25 DIAGNOSIS — E782 Mixed hyperlipidemia: Secondary | ICD-10-CM | POA: Diagnosis not present

## 2023-12-25 DIAGNOSIS — R944 Abnormal results of kidney function studies: Secondary | ICD-10-CM

## 2023-12-25 DIAGNOSIS — G8929 Other chronic pain: Secondary | ICD-10-CM

## 2023-12-25 DIAGNOSIS — Z23 Encounter for immunization: Secondary | ICD-10-CM

## 2023-12-25 DIAGNOSIS — M5441 Lumbago with sciatica, right side: Secondary | ICD-10-CM

## 2023-12-25 DIAGNOSIS — R7303 Prediabetes: Secondary | ICD-10-CM

## 2023-12-25 DIAGNOSIS — Z713 Dietary counseling and surveillance: Secondary | ICD-10-CM

## 2023-12-25 DIAGNOSIS — R202 Paresthesia of skin: Secondary | ICD-10-CM

## 2023-12-25 DIAGNOSIS — Z8619 Personal history of other infectious and parasitic diseases: Secondary | ICD-10-CM | POA: Insufficient documentation

## 2023-12-25 DIAGNOSIS — F331 Major depressive disorder, recurrent, moderate: Secondary | ICD-10-CM | POA: Diagnosis not present

## 2023-12-25 DIAGNOSIS — Z79899 Other long term (current) drug therapy: Secondary | ICD-10-CM

## 2023-12-25 DIAGNOSIS — R519 Headache, unspecified: Secondary | ICD-10-CM

## 2023-12-25 DIAGNOSIS — R5382 Chronic fatigue, unspecified: Secondary | ICD-10-CM

## 2023-12-25 DIAGNOSIS — M5442 Lumbago with sciatica, left side: Secondary | ICD-10-CM

## 2023-12-25 MED ORDER — TOPIRAMATE 25 MG PO TABS: ORAL_TABLET | ORAL | 0 refills | Status: DC

## 2023-12-25 MED ORDER — PHENTERMINE HCL 37.5 MG PO CAPS: 37.5000 mg | ORAL_CAPSULE | ORAL | 0 refills | Status: DC

## 2023-12-25 MED ORDER — TIRZEPATIDE-WEIGHT MANAGEMENT 2.5 MG/0.5ML ~~LOC~~ SOLN: 2.5000 mg | SUBCUTANEOUS | 0 refills | Status: DC

## 2023-12-25 MED ORDER — PHENTERMINE HCL 15 MG PO CAPS: 15.0000 mg | ORAL_CAPSULE | ORAL | 0 refills | Status: DC

## 2023-12-25 NOTE — Patient Instructions (Signed)
 I strongly encourage you to incorporate exercise into your daily routine (at least 30 minutes most days of the week) and monitor your caloric intake, restricting it to around 1700 calories per day.  - I generally encourage patients to measure/weigh their foods/beverages for a few days to get a more accurate idea of what different amounts of things look like on their plate or in their glass.  - Using smaller plates/glasses also helps in that it tricks our minds into thinking we've eaten more than we have. Studies have shown that we eat more when presented with larger plates, even with the same amount of food on them.   - Plan to eat until you are no longer hungry, rather than until you're full.  If you don't normally exercise, start with something simple or more enjoyable. You can plan to walk for your half hour or do something like dancing if you enjoy it.  Build up your activity over time; this will make it more enjoyable and reduce your risk of injury.  Here are some videos which offer a variety of different workout classes with different levels: [email or web address]  It is completely free. If a full video is too much for you to do, you can do them in bite-sized chunks (just pausing when needed and resuming later in the day).   Even if these actions don't ultimately lead to weight loss, increasing your activity will still help to reduce your insulin resistance and will improve your cardiovascular health (making heart attack/stroke less likely as you age).

## 2023-12-25 NOTE — Progress Notes (Signed)
 New patient visit   Patient: Anne Mendoza   DOB: July 23, 1980   43 y.o. Female  MRN: [id number] Visit Date: 12/25/2023  Today's healthcare provider: LAURAINE LOISE BUOY, DO   Chief Complaint  Patient presents with   New Patient (Initial Visit)    Discuss weight and possible treatment, fatigue, depression, headaches and Bp    Subjective    Anne Mendoza is a 43 y.o. female who presents today as a new patient to establish care.  HPI HPI     New Patient (Initial Visit)    Additional comments: Discuss weight and possible treatment, fatigue, depression, headaches and Bp       Last edited by Thelbert Eulalio HERO, CMA on 12/25/2023  9:36 AM.       Anne Mendoza is a 43 year old female who presents to establish care and discuss weight management, fatigue, depression, headaches, and blood pressure.  She has a history of elevated blood pressure, particularly during her pregnancies, which required bed rest. Post-pregnancy, her blood pressure did not normalize, and she experienced weight gain. She has not been on medication for hypertension but notes that her blood pressure has been monitored over the past year.  She has a history of prediabetes, primarily during her pregnancies. Her blood pressure was checked during a visit for shingles earlier this year, which was her second episode, the first occurring in her twenties.  She describes experiencing depression since around the age of 62, coinciding with weight gain and changes in her menstrual cycle. She reports irregular periods, with some lasting over a week and others being very heavy, impacting her daily activities. She associates her depression with her physical health, noting that when she was thinner, she felt better mentally.  She experiences frequent headaches, sometimes progressing to migraines. She has been dealing with headaches since childhood. Over-the-counter medications like Advil  sometimes help, but not always.  Recently, her migraine medication was ineffective. Headaches - wakes up with them  - sometimes relieved by advil   - sometimes progresses to migraine  - more recently not relieved by anything  She reports significant weight gain since her last child in 2019, despite attempts to lose weight through diet and exercise. She describes a lack of motivation due to minimal results from her efforts. Her current weight is the highest it has ever been, even compared to her pregnancies.  - Previously used treadmill or bike - 30-60 minutes at a time every day.  - No current physical activity due to failed efforts in the past  She experiences leg pain, which she initially thought was a pulled muscle. The pain was persistent last year, affecting her ability to walk and exercise. She underwent physical therapy without significant improvement. The pain sometimes radiates down her leg, causing tingling.  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 She acknowledges poor eating habits, particularly a high intake of sweets and snacks, and a lack of physical activity.  She experiences significant fatigue, often feeling tired throughout the day, and sometimes feels sleepy while driving. No snoring but reports morning headaches.     Past Medical History:  Diagnosis Date   Gestational hypertension 04/30/2017   Pre-eclampsia    Past Surgical History:  Procedure Laterality Date   CHOLECYSTECTOMY     Family Status  Relation Name Status   Mother  Alive   Father  Alive   Mat Aunt  Alive   Mat Uncle  Deceased   Pat Uncle  Alive   MGM  Deceased   MGF  Deceased   PGM  Deceased   PGF  Deceased   Cousin maternal Deceased   Cousin  Deceased   Cousin  Deceased   Cousin  Deceased  No partnership data on file   Family History  Problem Relation Age of Onset   Prostate cancer Father    Pancreatic cancer Maternal Aunt    Lung cancer Maternal Uncle     Prostate cancer Paternal Uncle    Colon cancer Maternal Grandmother        d. 10s   Lung cancer Maternal Grandfather        d. 4s   Prostate cancer Paternal Grandfather    Breast cancer Cousin        dx 54s d. 48s   Brain cancer Cousin    Leukemia Cousin    Social History   Socioeconomic History   Marital status: Married    Spouse name: Not on file   Number of children: Not on file   Years of education: Not on file   Highest education level: Not on file  Occupational History   Not on file  Tobacco Use   Smoking status: Never   Smokeless tobacco: Never  Vaping Use   Vaping status: Never Used  Substance and Sexual Activity   Alcohol use: Yes    Comment: socially   Drug use: No   Sexual activity: Yes    Birth control/protection: None    Comment: Still deciding  Other Topics Concern   Not on file  Social History Narrative   She has 5 children 3 girls and 2 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    Social Drivers of Corporate investment banker Strain: Low Risk  (12/25/2023)   Overall Financial Resource Strain (CARDIA)    Difficulty of Paying Living Expenses: Not hard at all  Food Insecurity: No Food Insecurity (12/25/2023)   Hunger Vital Sign    Worried About Running Out of Food in the Last Year: Never true    Ran Out of Food in the Last Year: Never true  Transportation Needs: No Transportation Needs (12/25/2023)   PRAPARE - Administrator, Civil Service (Medical): No    Lack of Transportation (Non-Medical): No  Physical Activity: Not on file  Stress: Stress Concern Present (12/25/2023)   [employer] of Occupational Health - Occupational Stress Questionnaire    Feeling of Stress: Rather much  Social Connections: Not on file   Outpatient Medications Prior to Visit  Medication Sig   albuterol  (VENTOLIN  HFA) 108 (90 Base) MCG/ACT inhaler Inhale 1-2 puffs into the lungs every 6 (six) hours as needed for wheezing or shortness of breath.   No facility-administered  medications prior to visit.   Allergies  Allergen Reactions   Nickel Rash    Immunization History  Administered Date(s) Administered   Zoster Recombinant(Shingrix ) 12/25/2023    Health Maintenance  Topic Date Due   Hepatitis C Screening  Never done   DTaP/Tdap/Td (1 - Tdap) Never done   Hepatitis B Vaccines 19-59 Average Risk (1 of 3 - 19+ 3-dose series) Never done   HPV VACCINES (1 - 3-dose SCDM series) Never done   Influenza Vaccine  07/14/2024 (Originally 11/15/2023)   COVID-19 Vaccine (1) 12/15/2024 (Originally 11/27/1987)   Mammogram  03/11/2025   Cervical Cancer Screening (HPV/Pap Cotest)  08/13/2027   HIV Screening  Completed   Pneumococcal Vaccine  Aged Out   Meningococcal B Vaccine  Aged Out    Patient Care Team: Jahnyla Parrillo, Lauraine SAILOR, DO as PCP - General (Family Medicine)       Objective    BP 137/83 (BP Location: Left Arm, Patient Position: Sitting, Cuff Size: Normal)   Pulse 88   Ht 5' 9 (1.753 m)   Wt 252 lb (114.3 kg)   SpO2 100%   BMI 37.21 kg/m     Physical Exam Vitals and nursing note reviewed.  Constitutional:      General: She is not in acute distress.    Appearance: Normal appearance.  HENT:     Head: Normocephalic and atraumatic.  Eyes:     General: No scleral icterus.    Conjunctiva/sclera: Conjunctivae normal.  Cardiovascular:     Rate and Rhythm: Normal rate.  Pulmonary:     Effort: Pulmonary effort is normal.  Neurological:     Mental Status: She is alert and oriented to person, place, and time. Mental status is at baseline.  Psychiatric:        Mood and Affect: Mood normal.        Behavior: Behavior normal.     Depression Screen    12/25/2023    9:59 AM 08/13/2022    1:48 PM  PHQ 2/9 Scores  PHQ - 2 Score 5 0  PHQ- 9 Score 19 2   Results for orders placed or performed in visit on 12/25/23  Comprehensive metabolic panel with GFR  Result Value Ref Range   Glucose 97 70 - 99 mg/dL   BUN 11 6 - 24 mg/dL   Creatinine, Ser  8.96 (H) 0.57 - 1.00 mg/dL   eGFR 70 >40 fO/fpw/8.26   BUN/Creatinine Ratio 11 9 - 23   Sodium 138 134 - 144 mmol/L   Potassium 4.5 3.5 - 5.2 mmol/L   Chloride 102 96 - 106 mmol/L   CO2 20 20 - 29 mmol/L   Calcium 9.7 8.7 - 10.2 mg/dL   Total Protein 7.2 6.0 - 8.5 g/dL   Albumin 4.6 3.9 - 4.9 g/dL   Globulin, Total 2.6 1.5 - 4.5 g/dL   Bilirubin Total 0.5 0.0 - 1.2 mg/dL   Alkaline Phosphatase 96 44 - 121 IU/L   AST 24 0 - 40 IU/L   ALT 19 0 - 32 IU/L  Hemoglobin A1c  Result Value Ref Range   Hgb A1c MFr Bld 5.7 (H) 4.8 - 5.6 %   Est. average glucose Bld gHb Est-mCnc 117 mg/dL  Lipid panel  Result Value Ref Range   Cholesterol, Total 247 (H) 100 - 199 mg/dL   Triglycerides 874 0 - 149 mg/dL   HDL 48 >60 mg/dL   VLDL Cholesterol Cal 23 5 - 40 mg/dL   LDL Chol Calc (NIH) 823 (H) 0 - 99 mg/dL   Chol/HDL Ratio 5.1 (H) 0.0 - 4.4 ratio  VITAMIN D  25 Hydroxy (Vit-D Deficiency, Fractures)  Result Value Ref Range   Vit D, 25-Hydroxy 30.4 30.0 - 100.0 ng/mL  TSH Rfx on Abnormal to Free T4  Result Value Ref Range   TSH 0.705 0.450 - 4.500 uIU/mL  Vitamin B12  Result Value Ref Range   Vitamin B-12 308 232 - 1,245 pg/mL    Assessment & Plan     Essential hypertension -     Comprehensive metabolic panel with GFR -     Tirzepatide -Weight Management; Inject 2.5 mg into the skin once a week.  Dispense: 2 mL;  Refill: 0  Establishing care with new doctor, encounter for  Morbid obesity (HCC) -     Tirzepatide -Weight Management; Inject 2.5 mg into the skin once a week.  Dispense: 2 mL; Refill: 0 -     Phentermine  HCl; Take 1 capsule (15 mg total) by mouth every morning. Take BEFORE the 37 mg tablets.  Dispense: 14 capsule; Refill: 0 -     Phentermine  HCl; Take 1 capsule (37.5 mg total) by mouth every morning. Take AFTER the 15 mg tablets  Dispense: 30 capsule; Refill: 0 -     Topiramate ; Week 1: Take 1 tablet daily.  Week 2: Take 2 tablets daily.  Week 3: Take 3 tablets daily.  Week 4:  Take 4 tablets daily.  If unable to tolerate higher dose, stay at maximum tolerated dose.  Dispense: 120 tablet; Refill: 0  Weight loss counseling, encounter for -     Tirzepatide -Weight Management; Inject 2.5 mg into the skin once a week.  Dispense: 2 mL; Refill: 0 -     Phentermine  HCl; Take 1 capsule (15 mg total) by mouth every morning. Take BEFORE the 37 mg tablets.  Dispense: 14 capsule; Refill: 0 -     Phentermine  HCl; Take 1 capsule (37.5 mg total) by mouth every morning. Take AFTER the 15 mg tablets  Dispense: 30 capsule; Refill: 0 -     Topiramate ; Week 1: Take 1 tablet daily.  Week 2: Take 2 tablets daily.  Week 3: Take 3 tablets daily.  Week 4: Take 4 tablets daily.  If unable to tolerate higher dose, stay at maximum tolerated dose.  Dispense: 120 tablet; Refill: 0  Moderate recurrent major depression (HCC)  Chronic fatigue  Morning headache  Chronic bilateral low back pain with sciatica, sciatica laterality unspecified  Moderate mixed hyperlipidemia not requiring statin therapy -     Lipid panel -     Tirzepatide -Weight Management; Inject 2.5 mg into the skin once a week.  Dispense: 2 mL; Refill: 0  Decreased glomerular filtration rate (GFR)  Paresthesia -     VITAMIN D  25 Hydroxy (Vit-D Deficiency, Fractures) -     TSH Rfx on Abnormal to Free T4 -     Vitamin B12  Prediabetes -     Hemoglobin A1c  Need for shingles vaccine -     Varicella-zoster vaccine IM  History of shingles Assessment & Plan: Two episodes - once around her waist in her 20s and once earlier this year on her face. Discussed shingles vaccine; will administer today.  Orders: -     Varicella-zoster vaccine IM  High risk medication use -     EKG 12-Lead      Essential hypertension Elevated blood pressure, particularly during pregnancies. Hypertension diagnosed to facilitate potential treatment with phentermine . - Discussed potential cardiovascular risks associated with phentermine  use,  including heart side effects.  Morbid obesity (BMI 35.0-39.9) with associated hypertension and mixed hyperlipidemia; weight loss counseling Morbid obesity with weight gain exacerbated by sedentary lifestyle and poor dietary habits. Previous weight loss success with calorie counting and exercise hindered by fatigue, depression, and chronic pain. - Order Zepbound . - Recommended patient check insurance coverage for weight loss medications. - Order EKG to assess eligibility for phentermine  - EKG showed normal sinus rhythm without evidence of ectopy or ST changes, with a rate of 78, PR interval 146, QRS 88 and QT/QTc 460/462.  Positive axis noted.  - Recommend maintaining a caloric deficit of 1700 calories per day. - Encourage  gradual increase in physical activity. - Discussed weight loss medications, including phentermine , topiramate , and injectable options like Wegovy and Zepbound . Emphasized importance of caloric deficit and sustainable lifestyle changes. Discussed potential side effects of phentermine , including cardiovascular risks such as chest pain, rapid heart rates, and dizziness.  Major depressive disorder with chronic fatigue Chronic fatigue and depression impacting daily activities and weight loss motivation. - Consider bupropion if alternative weight loss medications selected or not approved.  Patient otherwise declines medication at this time.  Morning headache disorder  Chronic morning headaches with migraines, leg pain, and paresthesia. Previous treatments with Advil  and migraine medications have been ineffective at times.   - Epworth sleep scale score 17.  Recommended sleep study; patient declined at this time. - Consider topiramate  for headache prevention and weight loss if insurance approves.  Recurrent shingles Two episodes of shingles, one in her twenties and another in February on her face. - Administer shingles vaccine.    Return in about 6 weeks (around 02/05/2024) for  Weight, Chronic f/u.     I discussed the assessment and treatment plan with the patient  The patient was provided an opportunity to ask questions and all were answered. The patient agreed with the plan and demonstrated an understanding of the instructions.   The patient was advised to call back or seek an in-person evaluation if the symptoms worsen or if the condition fails to improve as anticipated.    LAURAINE LOISE BUOY, DO  [facility] [phone] (phone) [phone] (fax)  [facility]

## 2023-12-25 NOTE — Assessment & Plan Note (Signed)
 Two episodes - once around her waist in her 37s and once earlier this year on her face. Discussed shingles vaccine; will administer today.

## 2023-12-26 LAB — COMPREHENSIVE METABOLIC PANEL WITH GFR
ALT: 19 IU/L (ref 0–32)
AST: 24 IU/L (ref 0–40)
Albumin: 4.6 g/dL (ref 3.9–4.9)
Alkaline Phosphatase: 96 IU/L (ref 44–121)
BUN/Creatinine Ratio: 11 (ref 9–23)
BUN: 11 mg/dL (ref 6–24)
Bilirubin Total: 0.5 mg/dL (ref 0.0–1.2)
CO2: 20 mmol/L (ref 20–29)
Calcium: 9.7 mg/dL (ref 8.7–10.2)
Chloride: 102 mmol/L (ref 96–106)
Creatinine, Ser: 1.03 mg/dL — ABNORMAL HIGH (ref 0.57–1.00)
Globulin, Total: 2.6 g/dL (ref 1.5–4.5)
Glucose: 97 mg/dL (ref 70–99)
Potassium: 4.5 mmol/L (ref 3.5–5.2)
Sodium: 138 mmol/L (ref 134–144)
Total Protein: 7.2 g/dL (ref 6.0–8.5)
eGFR: 70 mL/min/1.73 (ref >59)

## 2023-12-26 LAB — TSH RFX ON ABNORMAL TO FREE T4: TSH: 0.705 u[IU]/mL (ref 0.450–4.500)

## 2023-12-26 LAB — LIPID PANEL
Chol/HDL Ratio: 5.1 ratio — ABNORMAL HIGH (ref 0.0–4.4)
Cholesterol, Total: 247 mg/dL — ABNORMAL HIGH (ref 100–199)
HDL: 48 mg/dL (ref >39)
LDL Chol Calc (NIH): 176 mg/dL — ABNORMAL HIGH (ref 0–99)
Triglycerides: 125 mg/dL (ref 0–149)
VLDL Cholesterol Cal: 23 mg/dL (ref 5–40)

## 2023-12-26 LAB — VITAMIN B12: Vitamin B-12: 308 pg/mL (ref 232–1245)

## 2023-12-26 LAB — HEMOGLOBIN A1C
Est. average glucose Bld gHb Est-mCnc: 117 mg/dL
Hgb A1c MFr Bld: 5.7 % — ABNORMAL HIGH (ref 4.8–5.6)

## 2023-12-26 LAB — VITAMIN D 25 HYDROXY (VIT D DEFICIENCY, FRACTURES): Vit D, 25-Hydroxy: 30.4 ng/mL (ref 30.0–100.0)

## 2024-01-08 ENCOUNTER — Ambulatory Visit: Payer: Self-pay | Admitting: Family Medicine

## 2024-01-22 ENCOUNTER — Ambulatory Visit (INDEPENDENT_AMBULATORY_CARE_PROVIDER_SITE_OTHER): Admitting: Family Medicine

## 2024-01-22 ENCOUNTER — Encounter: Payer: Self-pay | Admitting: Family Medicine

## 2024-01-22 VITALS — BP 126/77 | HR 102 | Ht 69.0 in | Wt 234.1 lb

## 2024-01-22 DIAGNOSIS — H18892 Other specified disorders of cornea, left eye: Secondary | ICD-10-CM | POA: Diagnosis not present

## 2024-01-22 DIAGNOSIS — L304 Erythema intertrigo: Secondary | ICD-10-CM | POA: Diagnosis not present

## 2024-01-22 DIAGNOSIS — H1045 Other chronic allergic conjunctivitis: Secondary | ICD-10-CM | POA: Diagnosis not present

## 2024-01-22 DIAGNOSIS — T50905A Adverse effect of unspecified drugs, medicaments and biological substances, initial encounter: Secondary | ICD-10-CM

## 2024-01-22 MED ORDER — TRIAMCINOLONE ACETONIDE 0.1 % EX CREA: 1.0000 | TOPICAL_CREAM | Freq: Two times a day (BID) | CUTANEOUS | 1 refills | Status: DC

## 2024-01-22 MED ORDER — NYSTATIN 100000 UNIT/GM EX POWD: 1.0000 | Freq: Two times a day (BID) | CUTANEOUS | 2 refills | Status: DC

## 2024-01-22 NOTE — Progress Notes (Signed)
 Acute Office Visit  Introduced to nurse practitioner role and practice setting.  All questions answered.  Discussed provider/patient relationship and expectations.   Subjective:     Patient ID: Anne Mendoza, female    DOB: Dec 30, 1980, 43 y.o.   MRN: [id number]  Chief Complaint  Patient presents with   Medication Reaction    Patient reports she increased her topiramate  to 4 tablets but then Monday she realized she noticed her eye turning red. Would like to see what to do as she knows she can not just stop taking rx. Pt reports she has not taken it today yet. Patient reports it is helping with headaches but would rather not have to worry about the risk     Discussed the use of AI scribe software for clinical note transcription with the patient, who gave verbal consent to proceed.  History of Present Illness Anne Mendoza is a 43 year old female who presents with eye pressure and redness after increasing her topiramate  dose for weight loss and headache prevention.  She has been experiencing significant eye pressure and redness, primarily in the left eye, after increasing her topiramate  dose to 100 mg. The symptoms began on Monday, with soreness and redness, particularly in the corner of the left eye, and a sensation of 'a lot of pressure.' There is also some pressure in the right eye.  She is concerned about the potential for acute glaucoma due to the eye pressure. She has been taking a combination of phentermine  and topiramate  for weight loss. The phentermine  dose is 37.5 mg, which she increased from 15 mg after 14 days. The topiramate  dose was recently increased to 100 mg on Thursday, 01/16/24. Prior to this increase, she did not experience these symptoms.  In terms of visual changes, she reports seeing light when blinking, which she describes as 'something was off.' She denies double vision or blurred vision. The eye feels different and is now painful, with some drainage present.  No trauma to the eye, sneezing, or other incidents that could explain the symptoms.   HPI  ROS      Objective:    BP 126/77 (BP Location: Right Arm, Patient Position: Sitting, Cuff Size: Large)   Pulse (!) 102   Ht 5' 9 (1.753 m)   Wt 234 lb 1.6 oz (106.2 kg)   SpO2 100%   BMI 34.57 kg/m    Physical Exam Constitutional:      General: She is not in acute distress.    Appearance: Normal appearance. She is obese. She is not ill-appearing, toxic-appearing or diaphoretic.  HENT:     Head: Normocephalic.     Nose: Nose normal.     Mouth/Throat:     Mouth: Mucous membranes are moist.     Pharynx: Oropharynx is clear.  Eyes:     General: Lids are normal. Lids are everted, no foreign bodies appreciated. Vision grossly intact. Gaze aligned appropriately. No visual field deficit.       Left eye: No foreign body, discharge or hordeolum.     Extraocular Movements: Extraocular movements intact.     Right eye: Normal extraocular motion.     Left eye: Normal extraocular motion and no nystagmus.     Conjunctiva/sclera:     Left eye: Left conjunctiva is injected.     Pupils: Pupils are equal, round, and reactive to light.  Cardiovascular:     Rate and Rhythm: Normal rate and regular rhythm.     Pulses:  Normal pulses.     Heart sounds: Normal heart sounds. No murmur heard.    No friction rub. No gallop.  Pulmonary:     Effort: No respiratory distress.     Breath sounds: No stridor. No wheezing, rhonchi or rales.  Chest:     Chest wall: No tenderness.  Musculoskeletal:     Right lower leg: No edema.     Left lower leg: No edema.  Skin:    General: Skin is warm and dry.     Capillary Refill: Capillary refill takes less than 2 seconds.     Findings: Erythema and rash present. Rash is urticarial.  Neurological:     General: No focal deficit present.     Mental Status: She is alert and oriented to person, place, and time. Mental status is at baseline.  Psychiatric:        Mood  and Affect: Mood normal.        Behavior: Behavior normal.        Thought Content: Thought content normal.        Judgment: Judgment normal.     No results found for any visits on 01/22/24.         Assessment & Plan:  Assessment and Plan Assessment & Plan Adverse medication reaction  - to  topiramate  with ocular symptoms - Pt has concerns for Increased eye pressure and redness in the left eye following an increase in topiramate  dosage to 100 mg.  - Symptoms include soreness, pressure, and visual disturbances such as photopsia. - EOMs intact, PERRLA - Suspected medication-related adverse reaction with concern for potential acute angle glaucoma. - Reduce topiramate  dose to 75 mg for a few days, then decrease to 50 mg daily- STOP medication if symptoms continue. Avoidance of abrupt stop due to poor side effects - Pt saw opthalmology after visit (see epic patient message on 01/22/24) - reported no concerns with eye pressure per eye exam, was given steroid drops for allergy  Morbid Obesity - BMI down to 34.57 Using phentermine  and topiramate  for weight loss. - decrease topiramate  to 50mg  daily given increased eye pressure, stop medication if eye pressure continues - continue phentermine , f/u with Dr. Donzella as scheduled.   L corneal redness - vision intact, EOMs intact - continue for reaction to topiramate  with increased dose - pt saw optho after visit, eye pressure normal, most likely allergy reaction - was given steroid dropps for three days by optho.  Candidal intertrigo - itching, erythema under bilateral breasts - Start nystatin powder - Triamcinolone for itching Keep the affected area clean and dry. Do not scratch your skin. Stay cool as much as you can. Use an air conditioner or a fan, if you have one. Apply over-the-counter and prescription medicines only as told by your doctor. If you were prescribed antibiotics, use them as told by your doctor. Do not stop using the  antibiotic even if you start to feel better. Keep all follow-up visits. Your doctor may need to check your skin to make sure that the treatment is working. How is this prevented? Shower and dry yourself well after being active. Use a hair dryer on a cool setting to dry between skin folds. Do not wear tight clothes. Wear clothes that: Are loose. Take moisture away from your body. Are made of cotton. Wear a bra that gives good support, if needed. Protect the skin in your groin and butt area as told by your doctor. To do this: Follow a  regular cleaning routine. Use creams, powders, or ointments that protect your skin. Change protection pads often. Stay at a healthy weight. Take care of your feet. This is very important if you have diabetes. You should: Wear shoes that fit well. Keep your feet dry. Wear clean cotton or wool socks. Keep your blood sugar under control if you have diabetes.    Problem List Items Addressed This Visit   None Visit Diagnoses       Medication reaction, initial encounter    -  Primary     Corneal erythema of left eye         Morbid obesity (HCC)         Intertrigo       Relevant Medications   nystatin (MYCOSTATIN/NYSTOP) powder   triamcinolone cream (KENALOG) 0.1 %       Meds ordered this encounter  Medications   nystatin (MYCOSTATIN/NYSTOP) powder    Sig: Apply 1 Application topically in the morning and at bedtime. Under breasts    Dispense:  60 g    Refill:  2   triamcinolone cream (KENALOG) 0.1 %    Sig: Apply 1 Application topically 2 (two) times daily. Under breasts for itching.    Dispense:  30 g    Refill:  1    Return if symptoms worsen or fail to improve.  Curtis DELENA Boom, FNP  I, Curtis DELENA Boom, FNP, have reviewed all documentation for this visit. The documentation on 01/22/24 for the exam, diagnosis, procedures, and orders are all accurate and complete.

## 2024-01-22 NOTE — Telephone Encounter (Signed)
 Patient in office today with Curtis Boom, PA-C

## 2024-02-05 ENCOUNTER — Ambulatory Visit: Payer: Self-pay | Admitting: Family Medicine

## 2024-02-05 DIAGNOSIS — E66811 Obesity, class 1: Secondary | ICD-10-CM | POA: Diagnosis not present

## 2024-02-05 DIAGNOSIS — Z713 Dietary counseling and surveillance: Secondary | ICD-10-CM | POA: Diagnosis not present

## 2024-02-05 DIAGNOSIS — L304 Erythema intertrigo: Secondary | ICD-10-CM

## 2024-02-05 MED ORDER — NYSTATIN 100000 UNIT/GM EX CREA: 1.0000 | TOPICAL_CREAM | Freq: Two times a day (BID) | CUTANEOUS | 2 refills | Status: AC | PRN

## 2024-02-05 MED ORDER — TRIAMCINOLONE ACETONIDE 0.1 % EX CREA: 1.0000 | TOPICAL_CREAM | Freq: Two times a day (BID) | CUTANEOUS | 1 refills | Status: AC

## 2024-02-05 MED ORDER — PHENTERMINE HCL 15 MG PO CAPS: 15.0000 mg | ORAL_CAPSULE | ORAL | 0 refills | Status: DC

## 2024-02-05 MED ORDER — PHENTERMINE HCL 37.5 MG PO CAPS: 37.5000 mg | ORAL_CAPSULE | ORAL | 1 refills | Status: DC

## 2024-02-05 MED ORDER — TOPIRAMATE 25 MG PO TABS: 75.0000 mg | ORAL_TABLET | Freq: Every day | ORAL | 0 refills | Status: DC

## 2024-02-05 NOTE — Progress Notes (Signed)
 Established patient visit   Patient: Anne Mendoza   DOB: 1980/05/27   43 y.o. Female  MRN: [id number] Visit Date: 02/05/2024  Today's healthcare provider: LAURAINE LOISE BUOY, DO   Chief Complaint  Patient presents with   Weight Check    Patient is here for refills for some of her medications and a weight check.  Declined all vaccines due to a cold.   Subjective    HPI Anne Mendoza is a 43 year old female who presents for a follow-up regarding weight management and medication side effects.  She is currently taking phentermine  and topiramate  for weight management. Initially, she experienced significant side effects, including dizziness, anxiety, and fatigue, particularly during the first month of treatment. She describes feeling 'off' and 'shaky,' with a lack of energy despite the medication's stimulant effects. She also experienced a lack of appetite and cravings, which she describes as 'shut off the food noise.'  She has reduced her topiramate  dose from four to three pills daily after consulting with her eye doctor, who did not find any issues thought to be related to the medication. She reports improvement in her symptoms since the dose adjustment, although she still experiences occasional dizziness, which she attributes to inadequate hydration.  She has lost 24 pounds since starting the medication regimen, which she attributes to the medication's effects on her appetite and cravings. Her clothes fit slightly looser, and her husband has noticed the weight loss. She does not have a specific goal weight but would be happy to be under 200 pounds.  No chest pain, shortness of breath, nausea, vomiting, belly pain, or vision changes. She is currently experiencing a cold, which she believes is causing her eyes to be red.      Medications: Outpatient Medications Prior to Visit  Medication Sig   [DISCONTINUED] nystatin (MYCOSTATIN/NYSTOP) powder Apply 1 Application topically  in the morning and at bedtime. Under breasts   [DISCONTINUED] phentermine  37.5 MG capsule Take 1 capsule (37.5 mg total) by mouth every morning. Take AFTER the 15 mg tablets   [DISCONTINUED] topiramate  (TOPAMAX ) 25 MG tablet Week 1: Take 1 tablet daily.  Week 2: Take 2 tablets daily.  Week 3: Take 3 tablets daily.  Week 4: Take 4 tablets daily.  If unable to tolerate higher dose, stay at maximum tolerated dose.   [DISCONTINUED] triamcinolone cream (KENALOG) 0.1 % Apply 1 Application topically 2 (two) times daily. Under breasts for itching.   [DISCONTINUED] albuterol  (VENTOLIN  HFA) 108 (90 Base) MCG/ACT inhaler Inhale 1-2 puffs into the lungs every 6 (six) hours as needed for wheezing or shortness of breath. (Patient not taking: Reported on 01/22/2024)   [DISCONTINUED] phentermine  15 MG capsule Take 1 capsule (15 mg total) by mouth every morning. Take BEFORE the 37 mg tablets. (Patient not taking: Reported on 01/22/2024)   No facility-administered medications prior to visit.    Review of Systems  Constitutional:  Positive for appetite change (expected). Negative for fatigue.  Eyes:  Negative for visual disturbance.  Respiratory:  Negative for chest tightness and shortness of breath.   Cardiovascular:  Negative for chest pain and palpitations.  Gastrointestinal:  Negative for abdominal pain, nausea and vomiting.  Neurological:  Negative for dizziness and weakness.        Objective    BP 134/88 (BP Location: Left Arm, Patient Position: Sitting, Cuff Size: Normal)   Pulse 84   Temp 99.3 F (37.4 C) (Oral)   Ht 5' 9 (1.753  m)   Wt 228 lb 14.4 oz (103.8 kg)   SpO2 99%   BMI 33.80 kg/m     Physical Exam Constitutional:      Appearance: Normal appearance.  HENT:     Head: Normocephalic and atraumatic.  Eyes:     General: No scleral icterus.    Extraocular Movements: Extraocular movements intact.     Conjunctiva/sclera: Conjunctivae normal.  Cardiovascular:     Rate and Rhythm:  Normal rate and regular rhythm.     Pulses: Normal pulses.     Heart sounds: Normal heart sounds.  Pulmonary:     Effort: Pulmonary effort is normal. No respiratory distress.     Breath sounds: Normal breath sounds.  Musculoskeletal:     Right lower leg: No edema.     Left lower leg: No edema.  Skin:    General: Skin is warm and dry.  Neurological:     Mental Status: She is alert and oriented to person, place, and time. Mental status is at baseline.  Psychiatric:        Mood and Affect: Mood normal.        Behavior: Behavior normal.      No results found for any visits on 02/05/24.  Assessment & Plan    Obesity (BMI 30.0-34.9) Assessment & Plan: Chronic, improving. Down 24 pounds since previous visit 6 weeks ago. Managed with phentermine  and topiramate . Initial side effects improved. Achieved 9% weight loss. Current strategy includes medication and dietary changes. Limited exercise due to initial side effects. She aims for weight under 200 pounds. - Counseled on importance of regular moderate-intensity exercise, averaging at least 150 minutes each week.    - Continue phentermine  and topiramate . - Provide tapering dose of phentermine  after 60 days. - Encourage hydration with water and electrolyte solutions. - Advise on healthy diet and nutrient monitoring. - Continue vitamin D  and B12 supplementation. - Monitor dietary intake for adequate nutrition.  Orders: -     Phentermine  HCl; Take 1 capsule (37.5 mg total) by mouth every morning.  Dispense: 30 capsule; Refill: 1 -     Topiramate ; Take 3 tablets (75 mg total) by mouth daily.  Dispense: 90 tablet; Refill: 0 -     Phentermine  HCl; Take 1 capsule (15 mg total) by mouth every morning for 14 days.  Dispense: 14 capsule; Refill: 0  Weight loss counseling, encounter for -     Phentermine  HCl; Take 1 capsule (37.5 mg total) by mouth every morning.  Dispense: 30 capsule; Refill: 1 -     Topiramate ; Take 3 tablets (75 mg total) by  mouth daily.  Dispense: 90 tablet; Refill: 0 -     Phentermine  HCl; Take 1 capsule (15 mg total) by mouth every morning for 14 days.  Dispense: 14 capsule; Refill: 0  Intertrigo Assessment & Plan: Chronic, stable, with episodic flares.  Managed with triamcinolone and nystatin.  Will refill prescriptions today. No acute concerns.  Continue to monitor.   Orders: -     Triamcinolone Acetonide; Apply 1 Application topically 2 (two) times daily. Under breasts for itching.  Dispense: 30 g; Refill: 1 -     Nystatin; Apply 1 Application topically 2 (two) times daily as needed (intertrigo recurrence).  Dispense: 30 g; Refill: 2     Return in about 2 months (around 04/14/2024) for Weight check-in.      I discussed the assessment and treatment plan with the patient  The patient was provided an opportunity to  ask questions and all were answered. The patient agreed with the plan and demonstrated an understanding of the instructions.   The patient was advised to call back or seek an in-person evaluation if the symptoms worsen or if the condition fails to improve as anticipated.    LAURAINE LOISE BUOY, DO  [facility] [phone] (phone) [phone] (fax)  [facility]

## 2024-02-05 NOTE — Patient Instructions (Addendum)
 For hydration: Sugar-free gatorade or powerade, Nuun, IV hydration, LMNT,

## 2024-02-05 NOTE — Assessment & Plan Note (Addendum)
 Chronic, improving. Down 24 pounds since previous visit 6 weeks ago. Managed with phentermine  and topiramate . Initial side effects improved. Achieved 9% weight loss. Current strategy includes medication and dietary changes. Limited exercise due to initial side effects. She aims for weight under 200 pounds. - Counseled on importance of regular moderate-intensity exercise, averaging at least 150 minutes each week.    - Continue phentermine  and topiramate . - Provide tapering dose of phentermine  after 60 days. - Encourage hydration with water and electrolyte solutions. - Advise on healthy diet and nutrient monitoring. - Continue vitamin D  and B12 supplementation. - Monitor dietary intake for adequate nutrition.

## 2024-02-16 ENCOUNTER — Encounter: Payer: Self-pay | Admitting: Obstetrics and Gynecology

## 2024-02-17 DIAGNOSIS — L304 Erythema intertrigo: Secondary | ICD-10-CM | POA: Insufficient documentation

## 2024-02-17 NOTE — Assessment & Plan Note (Signed)
 Chronic, stable, with episodic flares.  Managed with triamcinolone and nystatin.  Will refill prescriptions today. No acute concerns.  Continue to monitor.

## 2024-03-03 ENCOUNTER — Other Ambulatory Visit: Payer: Self-pay | Admitting: Family Medicine

## 2024-03-03 DIAGNOSIS — Z713 Dietary counseling and surveillance: Secondary | ICD-10-CM

## 2024-03-03 DIAGNOSIS — E66811 Obesity, class 1: Secondary | ICD-10-CM

## 2024-03-04 NOTE — Telephone Encounter (Signed)
 LOV- 02/05/2024 NOV- 04/14/2024 LRF- 02/05/2024 Outpatient Medication Detail   Disp Refills Start End   topiramate  (TOPAMAX ) 25 MG tablet 90 tablet 0 02/05/2024 --   Sig - Route: Take 3 tablets (75 mg total) by mouth daily. - Oral   Sent to pharmacy as: topiramate  (TOPAMAX ) 25 MG tablet   E-Prescribing Status: Receipt confirmed by pharmacy (02/05/2024  9:58 AM EDT)

## 2024-03-06 ENCOUNTER — Telehealth: Payer: Self-pay

## 2024-03-06 NOTE — Telephone Encounter (Signed)
 Copied from CRM #[id number]. Topic: Clinical - Medication Question >> Mar 06, 2024  8:03 AM Rosaria BRAVO wrote: Reason for CRM: Pt has a question about her topiramate  (TOPAMAX ) 25 MG tablet  She is supposed to taper off this medicine and she wants to know how to do this, timing wise.   Best contact: [phone]

## 2024-03-11 NOTE — Telephone Encounter (Signed)
 Called patient to inform her of the provider's message regarding tapering off Topamax , patient verbalized understanding.

## 2024-04-14 ENCOUNTER — Ambulatory Visit (INDEPENDENT_AMBULATORY_CARE_PROVIDER_SITE_OTHER): Admitting: Family Medicine

## 2024-04-14 ENCOUNTER — Encounter: Payer: Self-pay | Admitting: Family Medicine

## 2024-04-14 VITALS — BP 125/85 | HR 79 | Temp 98.5°F | Ht 69.0 in | Wt 218.1 lb

## 2024-04-14 DIAGNOSIS — L700 Acne vulgaris: Secondary | ICD-10-CM

## 2024-04-14 DIAGNOSIS — Z23 Encounter for immunization: Secondary | ICD-10-CM | POA: Diagnosis not present

## 2024-04-14 DIAGNOSIS — E66811 Obesity, class 1: Secondary | ICD-10-CM | POA: Diagnosis not present

## 2024-04-14 DIAGNOSIS — Z7689 Persons encountering health services in other specified circumstances: Secondary | ICD-10-CM

## 2024-04-14 DIAGNOSIS — Z8619 Personal history of other infectious and parasitic diseases: Secondary | ICD-10-CM

## 2024-04-14 DIAGNOSIS — Z6832 Body mass index (BMI) 32.0-32.9, adult: Secondary | ICD-10-CM

## 2024-04-14 MED ORDER — TRETINOIN 0.01 % EX GEL: Freq: Every day | CUTANEOUS | 0 refills | Status: AC

## 2024-04-14 NOTE — Progress Notes (Signed)
 "     Established patient visit   Patient: Anne Mendoza   DOB: [birth year]   43 y.o. Female  MRN: [id number] Visit Date: 04/14/2024  Today's healthcare provider: LAURAINE LOISE BUOY, DO   Chief Complaint  Patient presents with   Weight Check    Patient states that she is here today for a weight check, states that she is tolerating the phentermine  well however she is going down on it only taking 15 mg.  Declined all vaccines   Subjective    HPI Anne Mendoza is a 43 year old female who presents with concerns about acne and hair growth changes.  She has experienced a recent increase in facial/neck hair growth and cystic acne, characterized by ingrown hairs that become infected and are slow to heal. The acne is accompanied by excessive hair growth, including white hairs, raising concerns about potential scarring. Various over-the-counter treatments have been ineffective.  She recently discontinued topiramate , which she had been tapering from 75 mg to 25 mg before stopping. She reports experiencing mood changes and eye pressure while taking the medication. She reports that she suspects topiramate  may have contributed to her symptoms. She was previously on phentermine  37 mg for weight loss, which was effective, but has since transitioned down to 15 mg tablets.  Her weight fluctuates between 212 to 218 pounds, with a goal to reduce it below 200 pounds. She maintains physical activity through walking and using a bike at the [facility] but experiences disrupted sleep leading to fatigue.  She has not received the HPV vaccine and is considering it for future protection. She is also due for a tetanus vaccine and is unsure about her hepatitis B vaccination status. She has a history of receiving the shingles vaccine and is considering the second dose.      Medications: Show/hide medication list[1]       Objective    BP 125/85 (BP Location: Right Arm, Patient Position: Sitting, Cuff Size:  Large)   Pulse 79   Temp 98.5 F (36.9 C) (Oral)   Ht 5' 9 (1.753 m)   Wt 218 lb 1.6 oz (98.9 kg)   SpO2 99%   BMI 32.21 kg/m     Physical Exam Vitals and nursing note reviewed.  Constitutional:      General: She is not in acute distress.    Appearance: Normal appearance.  HENT:     Head: Normocephalic and atraumatic.  Eyes:     General: No scleral icterus.    Conjunctiva/sclera: Conjunctivae normal.  Cardiovascular:     Rate and Rhythm: Normal rate.  Pulmonary:     Effort: Pulmonary effort is normal.  Neurological:     Mental Status: She is alert and oriented to person, place, and time. Mental status is at baseline.  Psychiatric:        Mood and Affect: Mood normal.        Behavior: Behavior normal.      No results found for any visits on 04/14/24.  Assessment & Plan    Obesity (BMI 30.0-34.9)  Encounter for weight management  Acne vulgaris -     Tretinoin; Apply topically at bedtime.  Dispense: 45 g; Refill: 0  History of shingles -     Varicella-zoster vaccine IM  Need for shingles vaccine -     Varicella-zoster vaccine IM  Need for HPV vaccine -     HPV 9-valent vaccine,Recombinat  Need for Tdap vaccination -     Tdap  vaccine greater than or equal to 7yo IM     Obesity (BMI 30.0-34.9); encounter for weight management Significant success with medication assisted weight loss, with loss of 34 pounds since September 2025.  Weight loss plateaued with end of phentermine  treatment. Topiramate  discontinued due to side effects. Considering another phentermine  trial after a break from the medication. Sleep disturbances affecting energy. - Recommended a minimum one-month break from phentermine  before another trial. - Continue calorie-restricted diet with emphasis on heart healthy choices  - Continue exercise routine as tolerated.  Acne vulgaris Severe acne with hair growth and scarring, possibly hormonal versus related to recent topiramate  use. Symptoms  improved but persistent. Considering tretinoin treatment. - Prescribed tretinoin with instructions to avoid sun exposure and use sunblock. - Monitor symptoms and consider hormonal evaluation if acne worsens or persists.  General Health Maintenance Discussed vaccinations for HPV, hepatitis B, shingles, and tetanus. HPV vaccine recommended. Shingles and tetanus vaccines due. Hepatitis B status unclear. - Administered HPV vaccine. - Administered shingles vaccine. - Administered Tdap vaccine. - Patient declines Hepatitis B vaccine today; plan to check hepatitis B surface antibody levels during next blood work.    Return in about 6 weeks (around 05/26/2024).      I discussed the assessment and treatment plan with the patient  The patient was provided an opportunity to ask questions and all were answered. The patient agreed with the plan and demonstrated an understanding of the instructions.   The patient was advised to call back or seek an in-person evaluation if the symptoms worsen or if the condition fails to improve as anticipated.    LAURAINE LOISE BUOY, DO  [facility] [phone] (phone) [phone] (fax)  [facility]    [1]  Outpatient Medications Prior to Visit  Medication Sig   nystatin  cream (MYCOSTATIN ) Apply 1 Application topically 2 (two) times daily as needed (intertrigo recurrence).   phentermine  15 MG capsule Take 1 capsule (15 mg total) by mouth every morning for 14 days.   triamcinolone  cream (KENALOG ) 0.1 % Apply 1 Application topically 2 (two) times daily. Under breasts for itching.   [DISCONTINUED] phentermine  37.5 MG capsule Take 1 capsule (37.5 mg total) by mouth every morning.   [DISCONTINUED] topiramate  (TOPAMAX ) 25 MG tablet TAKE 3 TABLETS BY MOUTH DAILY   No facility-administered medications prior to visit.   "

## 2024-04-20 ENCOUNTER — Other Ambulatory Visit: Payer: Self-pay

## 2024-04-20 ENCOUNTER — Emergency Department
Admission: EM | Admit: 2024-04-20 | Discharge: 2024-04-20 | Disposition: A | Discharge status: Home / Self Care | Attending: Emergency Medicine | Admitting: Emergency Medicine

## 2024-04-20 ENCOUNTER — Emergency Department

## 2024-04-20 DIAGNOSIS — R519 Headache, unspecified: Secondary | ICD-10-CM | POA: Diagnosis present

## 2024-04-20 LAB — BASIC METABOLIC PANEL WITH GFR
Anion gap: 9 (ref 5–15)
BUN: 5 mg/dL — ABNORMAL LOW (ref 6–20)
CO2: 25 mmol/L (ref 22–32)
Calcium: 9.4 mg/dL (ref 8.9–10.3)
Chloride: 106 mmol/L (ref 98–111)
Creatinine, Ser: 0.82 mg/dL (ref 0.44–1.00)
GFR, Estimated: >60 mL/min
Glucose, Bld: 104 mg/dL — ABNORMAL HIGH (ref 70–99)
Potassium: 3.9 mmol/L (ref 3.5–5.1)
Sodium: 140 mmol/L (ref 135–145)

## 2024-04-20 LAB — CBC
HCT: 37.3 % (ref 36.0–46.0)
Hemoglobin: 12 g/dL (ref 12.0–15.0)
MCH: 27.8 pg (ref 26.0–34.0)
MCHC: 32.2 g/dL (ref 30.0–36.0)
MCV: 86.3 fL (ref 80.0–100.0)
Platelets: 350 K/uL (ref 150–400)
RBC: 4.32 MIL/uL (ref 3.87–5.11)
RDW: 12.7 % (ref 11.5–15.5)
WBC: 10.2 K/uL (ref 4.0–10.5)
nRBC: 0 % (ref 0.0–0.2)

## 2024-04-20 MED ORDER — IOHEXOL 350 MG/ML SOLN
75.0000 mL | Freq: Once | INTRAVENOUS | Status: AC | PRN
  Administered 2024-04-20: 75 mL via INTRAVENOUS

## 2024-04-20 MED ORDER — KETOROLAC TROMETHAMINE 30 MG/ML IJ SOLN
30.0000 mg | Freq: Once | INTRAMUSCULAR | Status: AC
  Administered 2024-04-20: 30 mg via INTRAVENOUS
  Filled 2024-04-20: qty 1

## 2024-04-20 MED ORDER — DIPHENHYDRAMINE HCL 50 MG/ML IJ SOLN
25.0000 mg | Freq: Once | INTRAMUSCULAR | Status: AC
  Administered 2024-04-20: 25 mg via INTRAVENOUS
  Filled 2024-04-20: qty 1

## 2024-04-20 MED ORDER — METOCLOPRAMIDE HCL 5 MG/ML IJ SOLN
10.0000 mg | Freq: Once | INTRAMUSCULAR | Status: AC
  Administered 2024-04-20: 10 mg via INTRAVENOUS
  Filled 2024-04-20: qty 2

## 2024-04-20 NOTE — ED Provider Notes (Signed)
 "  [facility] Provider Note    Event Date/Time   First MD Initiated Contact with Patient 04/20/24 [phone]     (approximate)   History   Headache   HPI  Anne Mendoza is a 44 y.o. female with a history of headaches who presents with complaints of severe abrupt onset headache at 4:30 in the morning that woke her up from sleep.  She reports this is atypical for her.  She was able to go back to sleep after taking Advil  but when she woke up she continued to have a headache although not as severe.  No neurodeficits.  No change in vision.  Stopped topiramate  X 1 week ago because of side effects     Physical Exam   Triage Vital Signs: ED Triage Vitals  Encounter Vitals Group     BP 04/20/24 0937 (!) 171/95     Girls Systolic BP Percentile --      Girls Diastolic BP Percentile --      Boys Systolic BP Percentile --      Boys Diastolic BP Percentile --      Pulse Rate 04/20/24 0937 86     Resp 04/20/24 0937 16     Temp 04/20/24 0937 98.1 F (36.7 C)     Temp Source 04/20/24 0937 Oral     SpO2 04/20/24 1245 100 %     Weight 04/20/24 0938 98.9 kg (218 lb 0.6 oz)     Height --      Head Circumference --      Peak Flow --      Pain Score 04/20/24 0937 5     Pain Loc --      Pain Education --      Exclude from Growth Chart --     Most recent vital signs: Vitals:   04/20/24 0937 04/20/24 1245  BP: (!) 171/95 (!) 158/87  Pulse: 86 64  Resp: 16 17  Temp: 98.1 F (36.7 C) 98.2 F (36.8 C)  SpO2:  100%     General: Awake, no distress.  CV:  Good peripheral perfusion.  Resp:  Normal effort.  Abd:  No distention.  Other:  Cranial nerves II through XII are normal, normal neurologic   ED Results / Procedures / Treatments   Labs (all labs ordered are listed, but only abnormal results are displayed) Labs Reviewed  BASIC METABOLIC PANEL WITH GFR - Abnormal; Notable for the following components:      Result Value   Glucose, Bld 104 (*)    BUN 5  (*)    All other components within normal limits  CBC     EKG     RADIOLOGY CT angiography negative for acute abnormality    PROCEDURES:  Critical Care performed:   Procedures   MEDICATIONS ORDERED IN ED: Medications  iohexol  (OMNIPAQUE ) 350 MG/ML injection 75 mL (75 mLs Intravenous Contrast Given 04/20/24 1123)  ketorolac  (TORADOL ) 30 MG/ML injection 30 mg (30 mg Intravenous Given 04/20/24 1243)  diphenhydrAMINE  (BENADRYL ) injection 25 mg (25 mg Intravenous Given 04/20/24 1243)  metoCLOPramide  (REGLAN ) injection 10 mg (10 mg Intravenous Given 04/20/24 1243)     IMPRESSION / MDM / ASSESSMENT AND PLAN / ED COURSE  I reviewed the triage vital signs and the nursing notes. Patient's presentation is most consistent with acute presentation with potential threat to life or bodily function.  Patient presents with abrupt onset severe headache that woke her from sleep, concerning for possible ICH/aneurysm/migraine  Sent for CT angiography  Lab work is reassuring  CT scan is negative for acute abnormality, discussed thyroid nodule with the patient the need for outpatient follow-up  Will treat with Toradol , Reglan , Benadryl   ----------------------------------------- 1:28 PM on 04/20/2024 ----------------------------------------- Patient reports mild improvement in headache, at this time she would like to go home, I think it is appropriate, no further workup needed at this time, return precautions discussed      FINAL CLINICAL IMPRESSION(S) / ED DIAGNOSES   Final diagnoses:  Acute nonintractable headache, unspecified headache type     Rx / DC Orders   ED Discharge Orders     None        Note:  This document was prepared using Dragon voice recognition software and may include unintentional dictation errors.   Arlander Charleston, MD 04/20/24 1328  "

## 2024-04-20 NOTE — ED Triage Notes (Signed)
 C/O headache. States has history of headaches, but this headache woke patient up this morning at 0400.  3 advil  taken, but pain did not fully resolve.  AAOx3. Skin warm and dry. NAD

## 2024-04-25 NOTE — Progress Notes (Unsigned)
" ° °  Established Patient Office Visit  Patient ID: Anne Mendoza, female    DOB: 1980-12-30  Age: 44 y.o. MRN: [id number] PCP: Donzella Lauraine SAILOR, DO  No chief complaint on file.   Subjective:     HPI  Discussed the use of AI scribe software for clinical note transcription with the patient, who gave verbal consent to proceed.  History of Present Illness    {History (Optional):23778}  ROS    Objective:     There were no vitals taken for this visit. {Vitals History (Optional):23777}  Physical Exam  {PhysExam Abridge (Optional):210964309} No results found for any visits on 04/27/24.  {Labs (Optional):23779}  The 10-year ASCVD risk score (Arnett DK, et al., 2019) is: 1.8%    Assessment & Plan:   Problem List Items Addressed This Visit   None   Assessment and Plan Assessment & Plan     No follow-ups on file.    Rockie Agent, MD [facility]   "

## 2024-04-27 ENCOUNTER — Encounter: Payer: Self-pay | Admitting: Family Medicine

## 2024-04-27 ENCOUNTER — Ambulatory Visit (INDEPENDENT_AMBULATORY_CARE_PROVIDER_SITE_OTHER): Admitting: Family Medicine

## 2024-04-27 VITALS — BP 150/102 | HR 81 | Ht 69.0 in | Wt 221.2 lb

## 2024-04-27 DIAGNOSIS — E041 Nontoxic single thyroid nodule: Secondary | ICD-10-CM

## 2024-04-27 DIAGNOSIS — G43711 Chronic migraine without aura, intractable, with status migrainosus: Secondary | ICD-10-CM | POA: Diagnosis not present

## 2024-04-27 DIAGNOSIS — R030 Elevated blood-pressure reading, without diagnosis of hypertension: Secondary | ICD-10-CM | POA: Diagnosis not present

## 2024-04-27 MED ORDER — SUMATRIPTAN SUCCINATE 25 MG PO TABS: 25.0000 mg | ORAL_TABLET | ORAL | 0 refills | Status: AC | PRN

## 2024-04-27 MED ORDER — PROPRANOLOL HCL 40 MG PO TABS: 40.0000 mg | ORAL_TABLET | Freq: Two times a day (BID) | ORAL | 2 refills | Status: AC

## 2024-04-27 NOTE — Assessment & Plan Note (Signed)
 Chronic condition  No current medications  BP elevated on two measurements today  Not sure how much her HA is impacting this  Recommend PCP follow up after HA treated

## 2024-04-27 NOTE — Patient Instructions (Signed)
 To keep you healthy, please keep in mind the following health maintenance items that you are due for:   Health Maintenance Due  Topic Date Due   HPV VACCINES (2 - Risk 3-dose series) 05/12/2024     Best Wishes,   Dr. Lang

## 2024-05-05 ENCOUNTER — Ambulatory Visit (HOSPITAL_COMMUNITY)

## 2024-05-28 ENCOUNTER — Ambulatory Visit: Admitting: Family Medicine
